# Patient Record
Sex: Female | Born: 1995
Health system: Southern US, Community
[De-identification: ages and names within clinical notes are randomized; demographics above are authoritative.]

## PROBLEM LIST (undated history)

## (undated) DIAGNOSIS — J45909 Unspecified asthma, uncomplicated: Secondary | ICD-10-CM

## (undated) DIAGNOSIS — J40 Bronchitis, not specified as acute or chronic: Secondary | ICD-10-CM

## (undated) HISTORY — PX: FINGER SURGERY: SHX640

---
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
## 2015-05-12 DIAGNOSIS — M79671 Pain in right foot: Secondary | ICD-10-CM | POA: Diagnosis present

## 2015-05-12 DIAGNOSIS — M79674 Pain in right toe(s): Secondary | ICD-10-CM | POA: Diagnosis not present

## 2015-05-13 ENCOUNTER — Emergency Department (HOSPITAL_BASED_OUTPATIENT_CLINIC_OR_DEPARTMENT_OTHER)
Admission: EM | Admit: 2015-05-13 | Discharge: 2015-05-13 | Disposition: A | Discharge status: Home / Self Care | Payer: Medicaid Other | Attending: Emergency Medicine | Admitting: Emergency Medicine

## 2015-05-13 ENCOUNTER — Emergency Department (HOSPITAL_BASED_OUTPATIENT_CLINIC_OR_DEPARTMENT_OTHER): Payer: Medicaid Other

## 2015-05-13 ENCOUNTER — Encounter (HOSPITAL_BASED_OUTPATIENT_CLINIC_OR_DEPARTMENT_OTHER): Payer: Self-pay | Admitting: Emergency Medicine

## 2015-05-13 DIAGNOSIS — M25571 Pain in right ankle and joints of right foot: Secondary | ICD-10-CM

## 2015-05-13 DIAGNOSIS — R52 Pain, unspecified: Secondary | ICD-10-CM

## 2015-05-13 MED ORDER — NAPROXEN 250 MG PO TABS
500.0000 mg | ORAL_TABLET | Freq: Once | ORAL | Status: AC
  Administered 2015-05-13: 500 mg via ORAL
  Filled 2015-05-13: qty 2

## 2015-05-13 MED ORDER — LORATADINE 10 MG PO TABS
10.0000 mg | ORAL_TABLET | Freq: Once | ORAL | Status: AC
  Administered 2015-05-13: 10 mg via ORAL
  Filled 2015-05-13: qty 1

## 2015-05-13 MED ORDER — NAPROXEN 375 MG PO TABS: 375.0000 mg | ORAL_TABLET | Freq: Two times a day (BID) | ORAL | Status: DC

## 2015-05-13 NOTE — ED Notes (Signed)
Patient reports that she has a possible FB in her right foot.

## 2015-05-13 NOTE — ED Notes (Signed)
MD at bedside. 

## 2015-05-13 NOTE — ED Notes (Signed)
No foreign body seen, no bleeding or discharge.

## 2015-05-13 NOTE — ED Notes (Signed)
Patient transported to X-ray 

## 2015-05-13 NOTE — ED Provider Notes (Signed)
CSN: [id number]     Arrival date & time 05/12/15  2359 History   First MD Initiated Contact with Patient 05/13/15 0102     Chief Complaint  Patient presents with  . Foot Pain     (Consider location/radiation/quality/duration/timing/severity/associated sxs/prior Treatment) Patient is a 19 y.o. female presenting with lower extremity pain. The history is provided by the patient.  Foot Pain This is a new problem. The current episode started 12 to 24 hours ago. The problem occurs constantly. The problem has not changed since onset.Pertinent negatives include no chest pain. Nothing aggravates the symptoms. Nothing relieves the symptoms. She has tried nothing for the symptoms. The treatment provided no relief.  thinks there was a bug under her foot while walking around barefoot now has toe pain  History reviewed. No pertinent past medical history. History reviewed. No pertinent past surgical history. History reviewed. No pertinent family history. History  Substance Use Topics  . Smoking status: Never Smoker   . Smokeless tobacco: Not on file  . Alcohol Use: No   OB History    No data available     Review of Systems  Cardiovascular: Negative for chest pain.  Skin: Negative for wound.  All other systems reviewed and are negative.     Allergies  Review of patient's allergies indicates no known allergies.  Home Medications   Prior to Admission medications   Not on File   BP 101/65 mmHg  Pulse 89  Temp(Src) 97.8 F (36.6 C) (Oral)  Resp 16  SpO2 100%  LMP 05/04/2015 Physical Exam  Constitutional: She is oriented to person, place, and time. She appears well-developed and well-nourished. No distress.  HENT:  Head: Normocephalic and atraumatic.  Mouth/Throat: Oropharynx is clear and moist.  Eyes: Conjunctivae are normal. Pupils are equal, round, and reactive to light.  Neck: Neck supple.  Cardiovascular: Normal rate, regular rhythm and intact distal pulses.    Pulmonary/Chest: Effort normal and breath sounds normal. No respiratory distress. She has no wheezes. She has no rales.  Abdominal: Soft. Bowel sounds are normal. There is no tenderness. There is no rebound and no guarding.  Musculoskeletal: Normal range of motion.       Right foot: There is normal range of motion, no bony tenderness, no swelling, normal capillary refill, no crepitus, no deformity and no laceration.  No lacerations nor punctures anywhere on the foot.  No envenomation sites.  No warmth or erythema. FROM   Neurological: She is alert and oriented to person, place, and time. She has normal reflexes.  Skin: Skin is warm and dry.  Psychiatric: She has a normal mood and affect.    ED Course  Procedures (including critical care time) Labs Review Labs Reviewed - No data to display  Imaging Review No results found.   EKG Interpretation None      MDM   Final diagnoses:  Pain   nSAIDS ICE AND ELEVATION NO SIGNS OF FOREIGN BODIES OR SKIN PUNCTURES OR LACERATIONS    Mathayus Stanbery, MD 05/13/15 0300

## 2015-11-05 ENCOUNTER — Encounter (HOSPITAL_BASED_OUTPATIENT_CLINIC_OR_DEPARTMENT_OTHER): Payer: Self-pay | Admitting: *Deleted

## 2015-11-05 ENCOUNTER — Emergency Department (HOSPITAL_BASED_OUTPATIENT_CLINIC_OR_DEPARTMENT_OTHER): Payer: Medicaid Other

## 2015-11-05 ENCOUNTER — Emergency Department (HOSPITAL_BASED_OUTPATIENT_CLINIC_OR_DEPARTMENT_OTHER)
Admission: EM | Admit: 2015-11-05 | Discharge: 2015-11-05 | Disposition: A | Discharge status: Home / Self Care | Payer: Medicaid Other | Attending: Emergency Medicine | Admitting: Emergency Medicine

## 2015-11-05 DIAGNOSIS — Y998 Other external cause status: Secondary | ICD-10-CM | POA: Diagnosis not present

## 2015-11-05 DIAGNOSIS — Z791 Long term (current) use of non-steroidal anti-inflammatories (NSAID): Secondary | ICD-10-CM | POA: Diagnosis not present

## 2015-11-05 DIAGNOSIS — S161XXA Strain of muscle, fascia and tendon at neck level, initial encounter: Secondary | ICD-10-CM | POA: Insufficient documentation

## 2015-11-05 DIAGNOSIS — S0990XA Unspecified injury of head, initial encounter: Secondary | ICD-10-CM | POA: Insufficient documentation

## 2015-11-05 DIAGNOSIS — Y9389 Activity, other specified: Secondary | ICD-10-CM | POA: Diagnosis not present

## 2015-11-05 DIAGNOSIS — S199XXA Unspecified injury of neck, initial encounter: Secondary | ICD-10-CM | POA: Diagnosis present

## 2015-11-05 DIAGNOSIS — W1839XA Other fall on same level, initial encounter: Secondary | ICD-10-CM | POA: Diagnosis not present

## 2015-11-05 DIAGNOSIS — Y9289 Other specified places as the place of occurrence of the external cause: Secondary | ICD-10-CM | POA: Diagnosis not present

## 2015-11-05 MED ORDER — IBUPROFEN 400 MG PO TABS
400.0000 mg | ORAL_TABLET | Freq: Once | ORAL | Status: DC
  Filled 2015-11-05: qty 1

## 2015-11-05 NOTE — ED Notes (Signed)
Pt ran and flipped on bed yesterday, c/o neck pain, states, "I can turn it from side to side, but hurts to moved up and down, can't hold my head up", pt is holding head up at this time. Also mentions HA, worse this morning upon waking. Alrt, NAD, calm, interactive, no dyspnea noted.

## 2015-11-05 NOTE — ED Notes (Signed)
Attempted rounding. Patient not in room.

## 2015-11-05 NOTE — Discharge Instructions (Signed)
Cervical Sprain  A cervical sprain is an injury in the neck in which the strong, fibrous tissues (ligaments) that connect your neck bones stretch or tear. Cervical sprains can range from mild to severe. Severe cervical sprains can cause the neck vertebrae to be unstable. This can lead to damage of the spinal cord and can result in serious nervous system problems. The amount of time it takes for a cervical sprain to get better depends on the cause and extent of the injury. Most cervical sprains heal in 1 to 3 weeks.  CAUSES   Severe cervical sprains may be caused by:    Contact sport injuries (such as from football, rugby, wrestling, hockey, auto racing, gymnastics, diving, martial arts, or boxing).    Motor vehicle collisions.    Whiplash injuries. This is an injury from a sudden forward and backward whipping movement of the head and neck.   Falls.   Mild cervical sprains may be caused by:    Being in an awkward position, such as while cradling a telephone between your ear and shoulder.    Sitting in a chair that does not offer proper support.    Working at a poorly designed computer station.    Looking up or down for long periods of time.   SYMPTOMS    Pain, soreness, stiffness, or a burning sensation in the front, back, or sides of the neck. This discomfort may develop immediately after the injury or slowly, 24 hours or more after the injury.    Pain or tenderness directly in the middle of the back of the neck.    Shoulder or upper back pain.    Limited ability to move the neck.    Headache.    Dizziness.    Weakness, numbness, or tingling in the hands or arms.    Muscle spasms.    Difficulty swallowing or chewing.    Tenderness and swelling of the neck.   DIAGNOSIS   Most of the time your health care provider can diagnose a cervical sprain by taking your history and doing a physical exam. Your health care provider will ask about previous neck injuries and any known neck  problems, such as arthritis in the neck. X-rays may be taken to find out if there are any other problems, such as with the bones of the neck. Other tests, such as a CT scan or MRI, may also be needed.   TREATMENT   Treatment depends on the severity of the cervical sprain. Mild sprains can be treated with rest, keeping the neck in place (immobilization), and pain medicines. Severe cervical sprains are immediately immobilized. Further treatment is done to help with pain, muscle spasms, and other symptoms and may include:   Medicines, such as pain relievers, numbing medicines, or muscle relaxants.    Physical therapy. This may involve stretching exercises, strengthening exercises, and posture training. Exercises and improved posture can help stabilize the neck, strengthen muscles, and help stop symptoms from returning.   HOME CARE INSTRUCTIONS    Put ice on the injured area.     Put ice in a plastic bag.     Place a towel between your skin and the bag.     Leave the ice on for 15-20 minutes, 3-4 times a day.    If your injury was severe, you may have been given a cervical collar to wear. A cervical collar is a two-piece collar designed to keep your neck from moving while it heals.      Do not remove the collar unless instructed by your health care provider.    If you have long hair, keep it outside of the collar.    Ask your health care provider before making any adjustments to your collar. Minor adjustments may be required over time to improve comfort and reduce pressure on your chin or on the back of your head.    Ifyou are allowed to remove the collar for cleaning or bathing, follow your health care provider's instructions on how to do so safely.    Keep your collar clean by wiping it with mild soap and water and drying it completely. If the collar you have been given includes removable pads, remove them every 1-2 days and hand wash them with soap and water. Allow them to air dry. They should be completely  dry before you wear them in the collar.    If you are allowed to remove the collar for cleaning and bathing, wash and dry the skin of your neck. Check your skin for irritation or sores. If you see any, tell your health care provider.    Do not drive while wearing the collar.    Only take over-the-counter or prescription medicines for pain, discomfort, or fever as directed by your health care provider.    Keep all follow-up appointments as directed by your health care provider.    Keep all physical therapy appointments as directed by your health care provider.    Make any needed adjustments to your workstation to promote good posture.    Avoid positions and activities that make your symptoms worse.    Warm up and stretch before being active to help prevent problems.   SEEK MEDICAL CARE IF:    Your pain is not controlled with medicine.    You are unable to decrease your pain medicine over time as planned.    Your activity level is not improving as expected.   SEEK IMMEDIATE MEDICAL CARE IF:    You develop any bleeding.   You develop stomach upset.   You have signs of an allergic reaction to your medicine.    Your symptoms get worse.    You develop new, unexplained symptoms.    You have numbness, tingling, weakness, or paralysis in any part of your body.   MAKE SURE YOU:    Understand these instructions.   Will watch your condition.   Will get help right away if you are not doing well or get worse.     This information is not intended to replace advice given to you by your health care provider. Make sure you discuss any questions you have with your health care provider.     Document Released: 09/22/2007 Document Revised: 11/30/2013 Document Reviewed: 06/02/2013  Elsevier Interactive Patient Education 2016 [employer]

## 2015-11-05 NOTE — ED Notes (Signed)
Patient transported to X-ray 

## 2015-11-05 NOTE — ED Provider Notes (Signed)
CSN: [id number]     Arrival date & time 11/05/15  2108 History  By signing my name below, I, Shelia Richards, attest that this documentation has been prepared under the direction and in the presence of Shelia BuccoMelanie Jameica Couts, MD. Electronically Signed: Jarvis Morganaylor Richards, ED Scribe. 11/05/2015. 11:03 PM.    Chief Complaint  Patient presents with  . Neck Injury   The history is provided by the patient. No language interpreter was used.    HPI Comments: Shelia Richards is a 19 y.o. female who presents to the Emergency Department complaining of constant, moderate, neck pain onset 2 days. She states she ran and flipped onto her bed and landed on her head. She denies any neck pain prior to the injury. Pt reports associated frontal HA that began today. She endorses the pain in her neck is exacerbated with turning her head. She denies any alleviating factors or medications taken prior to arrival. Pt denies any LOC during the injury. She denies any numbness or weakness in upper extremities, nausea, vomiting, fevers, or other associated symptoms.   History reviewed. No pertinent past medical history. Past Surgical History  Procedure Laterality Date  . Finger surgery Left     ring finger, couple years ago   History reviewed. No pertinent family history. Social History  Substance Use Topics  . Smoking status: Never Smoker   . Smokeless tobacco: None  . Alcohol Use: No   OB History    No data available     Review of Systems  Constitutional: Negative for fever, chills, diaphoresis and fatigue.  HENT: Negative for congestion, rhinorrhea and sneezing.   Eyes: Negative.   Respiratory: Negative for cough, chest tightness and shortness of breath.   Cardiovascular: Negative for chest pain and leg swelling.  Gastrointestinal: Negative for nausea, vomiting, abdominal pain, diarrhea and blood in stool.  Genitourinary: Negative for frequency, hematuria, flank pain and difficulty urinating.  Musculoskeletal:  Positive for neck pain. Negative for back pain and arthralgias.  Skin: Negative for rash.  Neurological: Positive for headaches. Negative for dizziness, speech difficulty, weakness and numbness.      Allergies  Review of patient's allergies indicates no known allergies.  Home Medications   Prior to Admission medications   Medication Sig Start Date End Date Taking? Authorizing Provider  naproxen (NAPROSYN) 375 MG tablet Take 1 tablet (375 mg total) by mouth 2 (two) times daily. 05/13/15   April Palumbo, MD   Triage Vitals: BP 106/66 mmHg  Pulse 86  Temp(Src) 99 F (37.2 C) (Oral)  Resp 18  Ht 5\' 5"  (1.651 m)  Wt 181 lb (82.101 kg)  BMI 30.12 kg/m2  SpO2 99%  LMP 10/23/2015  Physical Exam  Constitutional: She is oriented to person, place, and time. She appears well-developed and well-nourished.  HENT:  Head: Normocephalic and atraumatic.  No photophobia  Eyes: Pupils are equal, round, and reactive to light.  Neck: Normal range of motion. Neck supple.  Cardiovascular: Normal rate, regular rhythm and normal heart sounds.   Pulmonary/Chest: Effort normal and breath sounds normal. No respiratory distress. She has no wheezes. She has no rales. She exhibits no tenderness.  Abdominal: Soft. Bowel sounds are normal. There is no tenderness. There is no rebound and no guarding.  Musculoskeletal: Normal range of motion. She exhibits no edema.       Cervical back: She exhibits tenderness.  Tenderness over c6-c7. No other pain along the spine.  Lymphadenopathy:    She has no cervical adenopathy.  Neurological:  She is alert and oriented to person, place, and time. She has normal strength. No sensory deficit.  Skin: Skin is warm and dry. No rash noted.  Psychiatric: She has a normal mood and affect.    ED Course  Procedures (including critical care time)  DIAGNOSTIC STUDIES: Oxygen Saturation is 99% on RA, normal by my interpretation.    COORDINATION OF CARE:  10:24 PM- will order  imaging of c-spine.  Pt advised of plan for treatment and pt agrees.    Labs Review Labs Reviewed - No data to display  Imaging Review Dg Cervical Spine Complete  11/05/2015  CLINICAL DATA:  Neck pain of 2 days duration. EXAM: CERVICAL SPINE - COMPLETE 4+ VIEW COMPARISON:  None. FINDINGS: There is mild reversal of cervical lordosis. This could represent muscle spasm. No fracture is evident. No bone lesion or bony destruction. No arthritic changes. IMPRESSION: Negative for acute fracture. Electronically Signed   By: Ellery Plunk M.D.   On: 11/05/2015 22:56   I have personally reviewed and evaluated these images and lab results as part of my medical decision-making.   EKG Interpretation None      MDM   Final diagnoses:  Neck strain, initial encounter    Patient presents with pain to her lower neck after flipping over onto her head. Evidence of fracture. No neurologic deficits. No signs of systemic illness. She was discharged home in good condition. She was advised to use ibuprofen for symptomatic relief and follow-up with her pediatrician if her symptoms are not improving.  I personally performed the services described in this documentation, which was scribed in my presence.  The recorded information has been reviewed and considered.     Shelia Bucco, MD 11/05/15 [phone]

## 2015-11-15 ENCOUNTER — Emergency Department (HOSPITAL_BASED_OUTPATIENT_CLINIC_OR_DEPARTMENT_OTHER)
Admission: EM | Admit: 2015-11-15 | Discharge: 2015-11-15 | Disposition: A | Discharge status: Home / Self Care | Payer: Medicaid Other | Attending: Emergency Medicine | Admitting: Emergency Medicine

## 2015-11-15 ENCOUNTER — Encounter (HOSPITAL_BASED_OUTPATIENT_CLINIC_OR_DEPARTMENT_OTHER): Payer: Self-pay

## 2015-11-15 DIAGNOSIS — Z3202 Encounter for pregnancy test, result negative: Secondary | ICD-10-CM | POA: Insufficient documentation

## 2015-11-15 DIAGNOSIS — R1013 Epigastric pain: Secondary | ICD-10-CM | POA: Insufficient documentation

## 2015-11-15 DIAGNOSIS — R51 Headache: Secondary | ICD-10-CM | POA: Insufficient documentation

## 2015-11-15 DIAGNOSIS — R1011 Right upper quadrant pain: Secondary | ICD-10-CM | POA: Insufficient documentation

## 2015-11-15 DIAGNOSIS — R197 Diarrhea, unspecified: Secondary | ICD-10-CM | POA: Insufficient documentation

## 2015-11-15 DIAGNOSIS — M545 Low back pain: Secondary | ICD-10-CM | POA: Insufficient documentation

## 2015-11-15 DIAGNOSIS — R1110 Vomiting, unspecified: Secondary | ICD-10-CM | POA: Insufficient documentation

## 2015-11-15 DIAGNOSIS — R1012 Left upper quadrant pain: Secondary | ICD-10-CM | POA: Insufficient documentation

## 2015-11-15 DIAGNOSIS — Z79899 Other long term (current) drug therapy: Secondary | ICD-10-CM | POA: Insufficient documentation

## 2015-11-15 LAB — COMPREHENSIVE METABOLIC PANEL
ALT: 15 U/L (ref 14–54)
ANION GAP: 6 (ref 5–15)
AST: 22 U/L (ref 15–41)
Albumin: 4.1 g/dL (ref 3.5–5.0)
Alkaline Phosphatase: 63 U/L (ref 38–126)
BUN: 10 mg/dL (ref 6–20)
CHLORIDE: 105 mmol/L (ref 101–111)
CO2: 28 mmol/L (ref 22–32)
CREATININE: 0.85 mg/dL (ref 0.44–1.00)
Calcium: 9.7 mg/dL (ref 8.9–10.3)
GFR calc non Af Amer: >60 mL/min (ref >60)
Glucose, Bld: 78 mg/dL (ref 65–99)
POTASSIUM: 4.3 mmol/L (ref 3.5–5.1)
SODIUM: 139 mmol/L (ref 135–145)
Total Bilirubin: 0.4 mg/dL (ref 0.3–1.2)
Total Protein: 7.9 g/dL (ref 6.5–8.1)

## 2015-11-15 LAB — CBC WITH DIFFERENTIAL/PLATELET
BASOS ABS: 0 10*3/uL (ref 0.0–0.1)
BASOS PCT: 0 %
EOS ABS: 0.1 10*3/uL (ref 0.0–0.7)
EOS PCT: 1 %
HCT: 40.3 % (ref 36.0–46.0)
Hemoglobin: 13.4 g/dL (ref 12.0–15.0)
Lymphocytes Relative: 43 %
Lymphs Abs: 3.3 10*3/uL (ref 0.7–4.0)
MCH: 27.8 pg (ref 26.0–34.0)
MCHC: 33.3 g/dL (ref 30.0–36.0)
MCV: 83.6 fL (ref 78.0–100.0)
MONO ABS: 0.7 10*3/uL (ref 0.1–1.0)
MONOS PCT: 9 %
Neutro Abs: 3.6 10*3/uL (ref 1.7–7.7)
Neutrophils Relative %: 47 %
PLATELETS: 368 10*3/uL (ref 150–400)
RBC: 4.82 MIL/uL (ref 3.87–5.11)
RDW: 13.3 % (ref 11.5–15.5)
WBC: 7.6 10*3/uL (ref 4.0–10.5)

## 2015-11-15 LAB — URINALYSIS, ROUTINE W REFLEX MICROSCOPIC
BILIRUBIN URINE: NEGATIVE
Glucose, UA: NEGATIVE mg/dL
Ketones, ur: NEGATIVE mg/dL
Leukocytes, UA: NEGATIVE
NITRITE: NEGATIVE
PROTEIN: NEGATIVE mg/dL
SPECIFIC GRAVITY, URINE: 1.028 (ref 1.005–1.030)
pH: 6 (ref 5.0–8.0)

## 2015-11-15 LAB — URINE MICROSCOPIC-ADD ON

## 2015-11-15 LAB — LIPASE, BLOOD: LIPASE: 24 U/L (ref 11–51)

## 2015-11-15 LAB — PREGNANCY, URINE: PREG TEST UR: NEGATIVE

## 2015-11-15 MED ORDER — ACETAMINOPHEN 325 MG PO TABS
650.0000 mg | ORAL_TABLET | Freq: Once | ORAL | Status: AC
  Administered 2015-11-15: 650 mg via ORAL
  Filled 2015-11-15: qty 2

## 2015-11-15 MED ORDER — RANITIDINE HCL 150 MG PO TABS: 150.0000 mg | ORAL_TABLET | Freq: Two times a day (BID) | ORAL | Status: DC

## 2015-11-15 MED ORDER — ACETAMINOPHEN 325 MG PO TABS
ORAL_TABLET | ORAL | Status: AC
  Filled 2015-11-15: qty 1

## 2015-11-15 MED ORDER — SODIUM CHLORIDE 0.9 % IV BOLUS (SEPSIS)
1000.0000 mL | Freq: Once | INTRAVENOUS | Status: AC
  Administered 2015-11-15: 1000 mL via INTRAVENOUS

## 2015-11-15 MED ORDER — GI COCKTAIL ~~LOC~~
30.0000 mL | Freq: Once | ORAL | Status: AC
  Administered 2015-11-15: 30 mL via ORAL
  Filled 2015-11-15: qty 30

## 2015-11-15 MED ORDER — PANTOPRAZOLE SODIUM 40 MG PO TBEC: 40.0000 mg | DELAYED_RELEASE_TABLET | Freq: Every day | ORAL | Status: DC

## 2015-11-15 NOTE — ED Provider Notes (Signed)
CSN: [id number]     Arrival date & time 11/15/15  1135 History   First MD Initiated Contact with Patient 11/15/15 1158     Chief Complaint  Patient presents with  . Abdominal Pain     (Consider location/radiation/quality/duration/timing/severity/associated sxs/prior Treatment) HPI  Shelia Richards is a 19 y.o. female with no significant PMH who presents for intermittent, gradually worsening, mild epigastric abdominal pain for the past month and a half. She describes it as sharp and it occasionally radiates across the upper abdomen. She states she sometimes feel like her "stomach is rumbling".  Patient has not tried anything for her symptoms. She states it could be related to food, however, it sometimes hurts in the absence of food. Associated symptoms include headache, vomiting, occasional bloody diarrhea, and low back pain. Denies nausea, constipation, sore throat, urinary symptoms, vaginal discharge or vaginal bleeding, or bowel/ bladder incontinence. LMP yesterday. Denies any history of abdominal surgeries or recent diet changes.  She states her diet consists of one small meal in the morning and then a large meal at night.  She reports she has not seen her pediatrician for this problem.  No recent trauma or injury.    History reviewed. No pertinent past medical history. Past Surgical History  Procedure Laterality Date  . Finger surgery Left     ring finger, couple years ago   No family history on file. Social History  Substance Use Topics  . Smoking status: Never Smoker   . Smokeless tobacco: None  . Alcohol Use: No   OB History    No data available     Review of Systems All other systems negative unless otherwise stated in HPI    Allergies  Review of patient's allergies indicates no known allergies.  Home Medications   Prior to Admission medications   Medication Sig Start Date End Date Taking? Authorizing Provider  pantoprazole (PROTONIX) 40 MG tablet Take 1 tablet (40 mg  total) by mouth daily. 11/15/15   Cheri FowlerKayla Mikeyla Music, PA-C  ranitidine (ZANTAC) 150 MG tablet Take 1 tablet (150 mg total) by mouth 2 (two) times daily. 11/15/15   Cheri FowlerKayla Makiya Jeune, PA-C   LMP 11/14/2015 Physical Exam  Constitutional: She is oriented to person, place, and time. She appears well-developed and well-nourished.  HENT:  Head: Normocephalic and atraumatic.  Mouth/Throat: Oropharynx is clear and moist.  Eyes: Conjunctivae are normal. Pupils are equal, round, and reactive to light.  Neck: Normal range of motion. Neck supple.  Cardiovascular: Normal rate, regular rhythm and normal heart sounds.   No murmur heard. Pulmonary/Chest: Effort normal and breath sounds normal. No accessory muscle usage or stridor. No respiratory distress. She has no wheezes. She has no rhonchi. She has no rales.  Abdominal: Soft. Bowel sounds are normal. She exhibits no distension. There is tenderness (mild) in the right upper quadrant, epigastric area and left upper quadrant. There is no rigidity, no rebound, no guarding and no CVA tenderness.  Musculoskeletal: Normal range of motion.  Lymphadenopathy:    She has no cervical adenopathy.  Neurological: She is alert and oriented to person, place, and time.  Speech clear without dysarthria.  Cranial nerves grossly intact. Strength and sensation intact bilaterally throughout upper and lower extremities.  No pronator drift.    Skin: Skin is warm and dry.  Psychiatric: She has a normal mood and affect. Her behavior is normal.    ED Course  Procedures (including critical care time) Labs Review Labs Reviewed  URINALYSIS, ROUTINE W REFLEX  MICROSCOPIC (NOT AT [facility]) - Abnormal; Notable for the following:    Hgb urine dipstick MODERATE (*)    All other components within normal limits  URINE MICROSCOPIC-ADD ON - Abnormal; Notable for the following:    Squamous Epithelial / LPF 0-5 (*)    Bacteria, UA RARE (*)    All other components within normal limits  PREGNANCY, URINE   COMPREHENSIVE METABOLIC PANEL  LIPASE, BLOOD  CBC WITH DIFFERENTIAL/PLATELET    Imaging Review No results found. I have personally reviewed and evaluated these images and lab results as part of my medical decision-making.   EKG Interpretation None      MDM   Final diagnoses:  Epigastric pain    Patient presents with 1.5 month hx of epigastric abdominal pain.  VSS, NAD.  On exam, heart RRR, lungs CTAB, abdomen soft with mild tenderness in the RUQ/epigastric/LUQ no rebound or guarding.  Will obtain labs.  Will give fluids, gi cocktail, and tylenol.  Suspect GERD vs dietary intolerance vs IBS vs flatulence vs biliary colic.  Low suspicion for appendicitis, SBO, pyelonephritis, ovarian torsion, or TOA.  Labs unremarkable.  Moderate hematuria--patient is on her period.  Symptoms improved with gi cocktail and tylenol.  Suspect GERD, will d/c home with protonix and zantac. Follow up PCP.  Discussed return precautions.  Patient agrees and acknowledges the above plan for discharge.  Case has been discussed with Dr. Anitra Lauth who agrees with the above plan for discharge.     Cheri Fowler, PA-C 11/15/15 1336  Gwyneth Sprout, MD 11/18/15 1540

## 2015-11-15 NOTE — ED Notes (Signed)
C/o abd pain x 1 month, lower back pain x today

## 2015-11-15 NOTE — ED Notes (Signed)
Pt assisted to restroom by Jonny RuizJohn, EMT and pt has a tylenol tablet in her hand. When Jonny RuizJohn asks pt why she has a pill, pt states 'I only wanted to take one of them." second tylenol tablet discarded, another tablet pulled from pyxis under "override" and given to pt. Pt is hesitant, states 'I think two are too many." explained to pt that two tablets are a normal dose, and that it won't hurt her. Pt takes second tablet at this time.

## 2015-11-15 NOTE — ED Notes (Signed)
Family now at bedside. Pt is alert and cooperative.

## 2015-11-15 NOTE — Discharge Instructions (Signed)
Abdominal Pain, Adult °Many things can cause abdominal pain. Usually, abdominal pain is not caused by a disease and will improve without treatment. It can often be observed and treated at home. Your health care provider will do a physical exam and possibly order blood tests and X-rays to help determine the seriousness of your pain. However, in many cases, more time must pass before a clear cause of the pain can be found. Before that point, your health care provider may not know if you need more testing or further treatment. °HOME CARE INSTRUCTIONS °Monitor your abdominal pain for any changes. The following actions may help to alleviate any discomfort you are experiencing: °· Only take over-the-counter or prescription medicines as directed by your health care provider. °· Do not take laxatives unless directed to do so by your health care provider. °· Try a clear liquid diet (broth, tea, or water) as directed by your health care provider. Slowly move to a bland diet as tolerated. °SEEK MEDICAL CARE IF: °· You have unexplained abdominal pain. °· You have abdominal pain associated with nausea or diarrhea. °· You have pain when you urinate or have a bowel movement. °· You experience abdominal pain that wakes you in the night. °· You have abdominal pain that is worsened or improved by eating food. °· You have abdominal pain that is worsened with eating fatty foods. °· You have a fever. °SEEK IMMEDIATE MEDICAL CARE IF: °· Your pain does not go away within 2 hours. °· You keep throwing up (vomiting). °· Your pain is felt only in portions of the abdomen, such as the right side or the left lower portion of the abdomen. °· You pass bloody or black tarry stools. °MAKE SURE YOU: °· Understand these instructions. °· Will watch your condition. °· Will get help right away if you are not doing well or get worse. °  °This information is not intended to replace advice given to you by your health care provider. Make sure you discuss  any questions you have with your health care provider. °  °Document Released: 09/04/2005 Document Revised: 08/16/2015 Document Reviewed: 08/04/2013 °Elsevier Interactive Patient Education ©2016 Elsevier Inc. ° °Gastroesophageal Reflux Disease, Adult °Normally, food travels down the esophagus and stays in the stomach to be digested. If a person has gastroesophageal reflux disease (GERD), food and stomach acid move back up into the esophagus. When this happens, the esophagus becomes sore and swollen (inflamed). Over time, GERD can make small holes (ulcers) in the lining of the esophagus. °HOME CARE °Diet  °· Follow a diet as told by your doctor. You may need to avoid foods and drinks such as: °¨ Coffee and tea (with or without caffeine). °¨ Drinks that contain alcohol. °¨ Energy drinks and sports drinks. °¨ Carbonated drinks or sodas. °¨ Chocolate and cocoa. °¨ Peppermint and mint flavorings. °¨ Garlic and onions. °¨ Horseradish. °¨ Spicy and acidic foods, such as peppers, chili powder, curry powder, vinegar, hot sauces, and BBQ sauce. °¨ Citrus fruit juices and citrus fruits, such as oranges, lemons, and limes. °¨ Tomato-based foods, such as red sauce, chili, salsa, and pizza with red sauce. °¨ Fried and fatty foods, such as donuts, french fries, potato chips, and high-fat dressings. °¨ High-fat meats, such as hot dogs, rib eye steak, sausage, ham, and bacon. °¨ High-fat dairy items, such as whole milk, butter, and cream cheese. °· Eat small meals often. Avoid eating large meals. °· Avoid drinking large amounts of liquid with your meals. °· Avoid   meals.  Avoid eating meals during the 2-3 hours before bedtime.  Avoid lying down right after you eat.  Do not exercise right after you eat. General Instructions  Pay attention to any changes in your symptoms.  Take over-the-counter and prescription medicines only as told by your doctor. Do not take aspirin, ibuprofen, or other NSAIDs unless your doctor says it is okay.  Do not use any tobacco  products, including cigarettes, chewing tobacco, and e-cigarettes. If you need help quitting, ask your doctor.  Wear loose clothes. Do not wear anything tight around your waist.  Raise (elevate) the head of your bed about 6 inches (15 cm).  Try to lower your stress. If you need help doing this, ask your doctor.  If you are overweight, lose an amount of weight that is healthy for you. Ask your doctor about a safe weight loss goal.  Keep all follow-up visits as told by your doctor. This is important. GET HELP IF:  You have new symptoms.  You lose weight and you do not know why it is happening.  You have trouble swallowing, or it hurts to swallow.  You have wheezing or a cough that keeps happening.  Your symptoms do not get better with treatment.  You have a hoarse voice. GET HELP RIGHT AWAY IF:  You have pain in your arms, neck, jaw, teeth, or back.  You feel sweaty, dizzy, or light-headed.  You have chest pain or shortness of breath.  You throw up (vomit) and your throw up looks like blood or coffee grounds.  You pass out (faint).  Your poop (stool) is bloody or black.  You cannot swallow, drink, or eat. This information is not intended to replace advice given to you by your health care provider. Make sure you discuss any questions you have with your health care provider.  Document Released: 05/13/2008 Document Revised: 08/16/2015 Document Reviewed: 03/22/2015  Elsevier Interactive Patient Education [employer]2016 Elsevier Inc.

## 2015-11-15 NOTE — ED Notes (Signed)
Pt crying, states that she is scared. This RN stays with pt and assists her to attempt to call her mother and her father, with no answer for either. Pt comforted by this rn, given call button to call me if she needs me. Warm blankets given, lights dimmed.

## 2015-11-27 ENCOUNTER — Encounter (HOSPITAL_BASED_OUTPATIENT_CLINIC_OR_DEPARTMENT_OTHER): Payer: Self-pay | Admitting: *Deleted

## 2015-11-27 ENCOUNTER — Emergency Department (HOSPITAL_BASED_OUTPATIENT_CLINIC_OR_DEPARTMENT_OTHER)
Admission: EM | Admit: 2015-11-27 | Discharge: 2015-11-27 | Discharge status: Against Medical Advice | Payer: Medicaid Other | Attending: Emergency Medicine | Admitting: Emergency Medicine

## 2015-11-27 DIAGNOSIS — J029 Acute pharyngitis, unspecified: Secondary | ICD-10-CM | POA: Insufficient documentation

## 2015-11-27 LAB — URINALYSIS, ROUTINE W REFLEX MICROSCOPIC
BILIRUBIN URINE: NEGATIVE
Glucose, UA: NEGATIVE mg/dL
HGB URINE DIPSTICK: NEGATIVE
KETONES UR: NEGATIVE mg/dL
Leukocytes, UA: NEGATIVE
NITRITE: NEGATIVE
PROTEIN: NEGATIVE mg/dL
SPECIFIC GRAVITY, URINE: 1.021 (ref 1.005–1.030)
pH: 8 (ref 5.0–8.0)

## 2015-11-27 LAB — RAPID STREP SCREEN (MED CTR MEBANE ONLY): STREPTOCOCCUS, GROUP A SCREEN (DIRECT): NEGATIVE

## 2015-11-27 LAB — PREGNANCY, URINE: PREG TEST UR: NEGATIVE

## 2015-11-27 NOTE — ED Notes (Signed)
Pt c/o generalized cold symptoms, has not tried anything over the counter to help with symptoms.  Pt is on Facebook during exam, no acute distress noted.  Pt has not had any emesis episodes today and has been able to hold down fluids without issue.

## 2015-11-27 NOTE — ED Notes (Signed)
Sore throat, vomiting and headache. Symptoms x 4 days.

## 2015-11-27 NOTE — ED Notes (Signed)
Pt seen leaving out of ED waiting room. Patient would not answer nurse when called.

## 2015-11-30 LAB — CULTURE, GROUP A STREP

## 2015-12-01 ENCOUNTER — Emergency Department (HOSPITAL_BASED_OUTPATIENT_CLINIC_OR_DEPARTMENT_OTHER)
Admission: EM | Admit: 2015-12-01 | Discharge: 2015-12-01 | Disposition: A | Discharge status: Home / Self Care | Payer: Medicaid Other | Attending: Emergency Medicine | Admitting: Emergency Medicine

## 2015-12-01 ENCOUNTER — Emergency Department (HOSPITAL_BASED_OUTPATIENT_CLINIC_OR_DEPARTMENT_OTHER): Payer: Medicaid Other

## 2015-12-01 ENCOUNTER — Encounter (HOSPITAL_BASED_OUTPATIENT_CLINIC_OR_DEPARTMENT_OTHER): Payer: Self-pay

## 2015-12-01 DIAGNOSIS — J029 Acute pharyngitis, unspecified: Secondary | ICD-10-CM

## 2015-12-01 DIAGNOSIS — J209 Acute bronchitis, unspecified: Secondary | ICD-10-CM | POA: Insufficient documentation

## 2015-12-01 DIAGNOSIS — Z79899 Other long term (current) drug therapy: Secondary | ICD-10-CM | POA: Insufficient documentation

## 2015-12-01 MED ORDER — AEROCHAMBER PLUS W/MASK MISC
1.0000 | Freq: Once | Status: AC
  Administered 2015-12-01: 1
  Filled 2015-12-01: qty 1

## 2015-12-01 MED ORDER — ALBUTEROL SULFATE HFA 108 (90 BASE) MCG/ACT IN AERS
2.0000 | INHALATION_SPRAY | RESPIRATORY_TRACT | Status: DC | PRN
  Administered 2015-12-01: 2 via RESPIRATORY_TRACT
  Filled 2015-12-01: qty 6.7

## 2015-12-01 MED ORDER — ONDANSETRON 8 MG PO TBDP: 8.0000 mg | ORAL_TABLET | Freq: Three times a day (TID) | ORAL | Status: DC | PRN

## 2015-12-01 MED ORDER — IPRATROPIUM-ALBUTEROL 0.5-2.5 (3) MG/3ML IN SOLN
3.0000 mL | RESPIRATORY_TRACT | Status: DC
  Administered 2015-12-01: 3 mL via RESPIRATORY_TRACT
  Filled 2015-12-01: qty 3

## 2015-12-01 NOTE — ED Notes (Signed)
Pt verbalizes understanding of d/c instructions and denies any further needs at this time. 

## 2015-12-01 NOTE — ED Notes (Signed)
Pt c/o sore throat and a cough x2wks, no distress

## 2015-12-01 NOTE — ED Notes (Signed)
MD at bedside. 

## 2015-12-01 NOTE — ED Provider Notes (Signed)
CSN: [id number]     Arrival date & time 12/01/15  0055 History   First MD Initiated Contact with Patient 12/01/15 0252     Chief Complaint  Patient presents with  . Sore Throat     (Consider location/radiation/quality/duration/timing/severity/associated sxs/prior Treatment) HPI  This is a 19 year old female complains of a two-week history of cough. It is productive of sputum but she does not know what color because she swallows it. She has also had some wheezing, notably in her sleep according to her roommate. She has had a fever as high as 102.3. She has a sore throat, worse with swallowing. She states she has been vomiting "every other day". She has not been taking anything for her symptoms except cough drops. She checked into the ED 4 days ago and had an unremarkable urinalysis, negative strep screen and negative strep culture; she left prior to physician evaluation.  History reviewed. No pertinent past medical history. Past Surgical History  Procedure Laterality Date  . Finger surgery Left     ring finger, couple years ago   No family history on file. Social History  Substance Use Topics  . Smoking status: Never Smoker   . Smokeless tobacco: None  . Alcohol Use: No   OB History    No data available     Review of Systems  All other systems reviewed and are negative.   Allergies  Review of patient's allergies indicates no known allergies.  Home Medications   Prior to Admission medications   Medication Sig Start Date End Date Taking? Authorizing Provider  pantoprazole (PROTONIX) 40 MG tablet Take 1 tablet (40 mg total) by mouth daily. 11/15/15   Cheri FowlerKayla Rose, PA-C  ranitidine (ZANTAC) 150 MG tablet Take 1 tablet (150 mg total) by mouth 2 (two) times daily. 11/15/15   Kayla Rose, PA-C   BP 119/57 mmHg  Pulse 81  Temp(Src) 98.6 F (37 C) (Oral)  Resp 16  SpO2 97%  LMP 11/14/2015   Physical Exam General: Well-developed, well-nourished female in no acute distress;  appearance consistent with age of record HENT: normocephalic; atraumatic; pharyngeal erythema Eyes: pupils equal, round and reactive to light; extraocular muscles intact Neck: supple; anterior cervical lymphadenopathy Heart: regular rate and rhythm Lungs: Coarse expiratory wheezes Abdomen: soft; nondistended; nontender; bowel sounds present Extremities: No deformity; full range of motion Neurologic: Awake, alert and oriented; motor function intact in all extremities and symmetric; no facial droop Skin: Warm and dry Psychiatric: Normal mood and affect    ED Course  Procedures (including critical care time)   MDM  Nursing notes and vitals signs, including pulse oximetry, reviewed.  Summary of this visit's results, reviewed by myself:  Imaging Studies: Dg Chest 2 View  12/01/2015  CLINICAL DATA:  Cough, congestion, and fever EXAM: CHEST  2 VIEW COMPARISON:  None. FINDINGS: When allowing for interstitial crowding at the bases there is no edema, consolidation, effusion, or pneumothorax. Normal heart size and mediastinal contours. IMPRESSION: No active cardiopulmonary disease. Electronically Signed   By: Marnee SpringJonathon  Watts M.D.   On: 12/01/2015 01:58   3:37 AM Lungs clear after DuoNeb treatment.    Paula LibraJohn Elida Harbin, MD 12/01/15 [phone]
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
## 2016-02-11 ENCOUNTER — Emergency Department (HOSPITAL_BASED_OUTPATIENT_CLINIC_OR_DEPARTMENT_OTHER)
Admission: EM | Admit: 2016-02-11 | Discharge: 2016-02-11 | Disposition: A | Discharge status: Home / Self Care | Payer: Self-pay | Attending: Emergency Medicine | Admitting: Emergency Medicine

## 2016-02-11 ENCOUNTER — Emergency Department (HOSPITAL_BASED_OUTPATIENT_CLINIC_OR_DEPARTMENT_OTHER): Payer: Self-pay

## 2016-02-11 ENCOUNTER — Encounter (HOSPITAL_BASED_OUTPATIENT_CLINIC_OR_DEPARTMENT_OTHER): Payer: Self-pay | Admitting: Emergency Medicine

## 2016-02-11 DIAGNOSIS — R112 Nausea with vomiting, unspecified: Secondary | ICD-10-CM | POA: Insufficient documentation

## 2016-02-11 DIAGNOSIS — J40 Bronchitis, not specified as acute or chronic: Secondary | ICD-10-CM

## 2016-02-11 DIAGNOSIS — R197 Diarrhea, unspecified: Secondary | ICD-10-CM | POA: Insufficient documentation

## 2016-02-11 DIAGNOSIS — Z79899 Other long term (current) drug therapy: Secondary | ICD-10-CM | POA: Insufficient documentation

## 2016-02-11 MED ORDER — BENZONATATE 100 MG PO CAPS: 100.0000 mg | ORAL_CAPSULE | Freq: Three times a day (TID) | ORAL | Status: DC

## 2016-02-11 MED ORDER — ALBUTEROL SULFATE HFA 108 (90 BASE) MCG/ACT IN AERS
2.0000 | INHALATION_SPRAY | RESPIRATORY_TRACT | Status: DC | PRN
  Administered 2016-02-11: 2 via RESPIRATORY_TRACT
  Filled 2016-02-11: qty 6.7

## 2016-02-11 NOTE — ED Provider Notes (Signed)
CSN: [id number]     Arrival date & time 02/11/16  1900 History  By signing my name below, I, Tanda Rockers, attest that this documentation has been prepared under the direction and in the presence of Rolan Bucco, MD. Electronically Signed: Tanda Rockers, ED Scribe. 02/11/2016. 7:51 PM.   Chief Complaint  Patient presents with  . Cough   The history is provided by the patient. No language interpreter was used.     HPI Comments: Laree Garron is a 20 y.o. female who presents to the Emergency Department complaining of gradual onset, constant, cough x 2 weeks. Pt states that the cough varies from dry to productive in nature.  When pt has a bad coughing spell, she feels short of breath. She also complains of chest pain with coughing only, vomiting, and diarrhea. Pt mentions having rhinorrhea and nasal congestion last week that has since resolved on its own. She reports a fever with Tmax 102 today but did not take any medications for it. Her temperature in the ED is 99.4. Denies abdominal pain, leg pain, leg swelling, or any other associated symptoms. No hx asthma.    History reviewed. No pertinent past medical history. Past Surgical History  Procedure Laterality Date  . Finger surgery Left     ring finger, couple years ago   History reviewed. No pertinent family history. Social History  Substance Use Topics  . Smoking status: Never Smoker   . Smokeless tobacco: None  . Alcohol Use: No   OB History    No data available     Review of Systems  Constitutional: Positive for fever. Negative for chills, diaphoresis and fatigue.  HENT: Positive for congestion (resolved) and rhinorrhea (resolved). Negative for sneezing.   Eyes: Negative.   Respiratory: Positive for cough and shortness of breath. Negative for chest tightness.   Cardiovascular: Negative for chest pain and leg swelling.  Gastrointestinal: Positive for nausea, vomiting and diarrhea. Negative for abdominal pain and blood in stool.   Genitourinary: Negative for frequency, hematuria, flank pain and difficulty urinating.  Musculoskeletal: Negative for back pain and arthralgias.  Skin: Negative for rash.  Neurological: Negative for dizziness, speech difficulty, weakness, numbness and headaches.   Allergies  Review of patient's allergies indicates no known allergies.  Home Medications   Prior to Admission medications   Medication Sig Start Date End Date Taking? Authorizing Provider  benzonatate (TESSALON) 100 MG capsule Take 1 capsule (100 mg total) by mouth every 8 (eight) hours. 02/11/16   Rolan Bucco, MD  ondansetron (ZOFRAN ODT) 8 MG disintegrating tablet Take 1 tablet (8 mg total) by mouth every 8 (eight) hours as needed for nausea or vomiting. 12/01/15   John Molpus, MD  pantoprazole (PROTONIX) 40 MG tablet Take 1 tablet (40 mg total) by mouth daily. 11/15/15   Cheri Fowler, PA-C  ranitidine (ZANTAC) 150 MG tablet Take 1 tablet (150 mg total) by mouth 2 (two) times daily. 11/15/15   Kayla Rose, PA-C   BP 104/68 mmHg  Pulse 94  Temp(Src) 99.4 F (37.4 C) (Oral)  Resp 18  Wt 182 lb 8 oz (82.781 kg)  SpO2 100%   Physical Exam  Constitutional: She is oriented to person, place, and time. She appears well-developed and well-nourished.  HENT:  Head: Normocephalic and atraumatic.  Right Ear: External ear normal.  Left Ear: External ear normal.  Mouth/Throat: Oropharynx is clear and moist.  Eyes: Pupils are equal, round, and reactive to light.  Neck: Normal range of motion. Neck  supple.  Cardiovascular: Normal rate, regular rhythm and normal heart sounds.   Pulmonary/Chest: Effort normal. No respiratory distress. She has wheezes. She has no rales. She exhibits no tenderness.  Trace expiratory wheezing in the bases bilaterally  Abdominal: Soft. Bowel sounds are normal. There is no tenderness. There is no rebound and no guarding.  Musculoskeletal: Normal range of motion. She exhibits no edema.  Lymphadenopathy:     She has no cervical adenopathy.  Neurological: She is alert and oriented to person, place, and time.  Skin: Skin is warm and dry. No rash noted.  Psychiatric: She has a normal mood and affect.    ED Course  Procedures (including critical care time)  DIAGNOSTIC STUDIES: Oxygen Saturation is 100% on RA, normal by my interpretation.    COORDINATION OF CARE: 7:49 PM-Discussed treatment plan which includes Rx cough medicine and OTC Mucinex with pt at bedside and pt agreed to plan.   Labs Review Labs Reviewed - No data to display  Imaging Review Dg Chest 2 View  02/11/2016  CLINICAL DATA:  Cough, body aches, fever and sore throat. EXAM: CHEST  2 VIEW COMPARISON:  12/01/2015. FINDINGS: The cardiac silhouette, mediastinal and hilar contours are within normal limits and stable. The lungs demonstrate mild peribronchial thickening and slight increased interstitial markings which could suggest bronchitis. No infiltrates or effusions. The bony thorax is intact. IMPRESSION: Findings suggest bronchitis.  No focal infiltrates. Electronically Signed   By: Rudie MeyerP.  Gallerani M.D.   On: 02/11/2016 19:41   I have personally reviewed and evaluated these images as part of my medical decision-making.   EKG Interpretation None      MDM   Final diagnoses:  Bronchitis   Patient presents with cough and upper respiratory symptoms. Her chest x-ray shows no evidence of pneumonia. There is evidence of bronchitis which is likely viral. She has intermittent trace wheezing but no increased work of breathing or ongoing wheezing. I did dispense her albuterol inhaler to see if this improves her symptoms. I advised her to stop using it after a couple days it doesn't seem to be in helping her symptoms. She was also given a prescription for [employer]. She states she's had some intermittent vomiting but states she appears a pizza earlier this afternoon prior to coming in and had no vomiting with that. She has no increased  work of breathing or hypoxia. She's otherwise well-appearing. She was discharged home in good condition. Return precautions were given.  I personally performed the services described in this documentation, which was scribed in my presence.  The recorded information has been reviewed and considered.      Rolan BuccoMelanie Absalom Aro, MD 02/11/16 2007

## 2016-02-11 NOTE — ED Notes (Signed)
MD at bedside. 

## 2016-02-11 NOTE — ED Notes (Signed)
Pt in c/o cough and generally not feeling well onset last week but worse onset 2 days ago.

## 2016-02-11 NOTE — Discharge Instructions (Signed)
Upper Respiratory Infection, Adult Most upper respiratory infections (URIs) are a viral infection of the air passages leading to the lungs. A URI affects the nose, throat, and upper air passages. The most common type of URI is nasopharyngitis and is typically referred to as "the common cold." URIs run their course and usually go away on their own. Most of the time, a URI does not require medical attention, but sometimes a bacterial infection in the upper airways can follow a viral infection. This is called a secondary infection. Sinus and middle ear infections are common types of secondary upper respiratory infections. Bacterial pneumonia can also complicate a URI. A URI can worsen asthma and chronic obstructive pulmonary disease (COPD). Sometimes, these complications can require emergency medical care and may be life threatening.  CAUSES Almost all URIs are caused by viruses. A virus is a type of germ and can spread from one person to another.  RISKS FACTORS You may be at risk for a URI if:   You smoke.   You have chronic heart or lung disease.  You have a weakened defense (immune) system.   You are very young or very old.   You have nasal allergies or asthma.  You work in crowded or poorly ventilated areas.  You work in health care facilities or schools. SIGNS AND SYMPTOMS  Symptoms typically develop 2-3 days after you come in contact with a cold virus. Most viral URIs last 7-10 days. However, viral URIs from the influenza virus (flu virus) can last 14-18 days and are typically more severe. Symptoms may include:   Runny or stuffy (congested) nose.   Sneezing.   Cough.   Sore throat.   Headache.   Fatigue.   Fever.   Loss of appetite.   Pain in your forehead, behind your eyes, and over your cheekbones (sinus pain).  Muscle aches.  DIAGNOSIS  Your health care provider may diagnose a URI by:  Physical exam.  Tests to check that your symptoms are not due to  another condition such as:  Strep throat.  Sinusitis.  Pneumonia.  Asthma. TREATMENT  A URI goes away on its own with time. It cannot be cured with medicines, but medicines may be prescribed or recommended to relieve symptoms. Medicines may help:  Reduce your fever.  Reduce your cough.  Relieve nasal congestion. HOME CARE INSTRUCTIONS   Take medicines only as directed by your health care provider.   Gargle warm saltwater or take cough drops to comfort your throat as directed by your health care provider.  Use a warm mist humidifier or inhale steam from a shower to increase air moisture. This may make it easier to breathe.  Drink enough fluid to keep your urine clear or pale yellow.   Eat soups and other clear broths and maintain good nutrition.   Rest as needed.   Return to work when your temperature has returned to normal or as your health care provider advises. You may need to stay home longer to avoid infecting others. You can also use a face mask and careful hand washing to prevent spread of the virus.  Increase the usage of your inhaler if you have asthma.   Do not use any tobacco products, including cigarettes, chewing tobacco, or electronic cigarettes. If you need help quitting, ask your health care provider. PREVENTION  The best way to protect yourself from getting a cold is to practice good hygiene.   Avoid oral or hand contact with people with cold   symptoms.   Wash your hands often if contact occurs.  There is no clear evidence that vitamin C, vitamin E, echinacea, or exercise reduces the chance of developing a cold. However, it is always recommended to get plenty of rest, exercise, and practice good nutrition.  SEEK MEDICAL CARE IF:   You are getting worse rather than better.   Your symptoms are not controlled by medicine.   You have chills.  You have worsening shortness of breath.  You have Ickes or red mucus.  You have yellow or Kuzniar nasal  discharge.  You have pain in your face, especially when you bend forward.  You have a fever.  You have swollen neck glands.  You have pain while swallowing.  You have white areas in the back of your throat. SEEK IMMEDIATE MEDICAL CARE IF:   You have severe or persistent:  Headache.  Ear pain.  Sinus pain.  Chest pain.  You have chronic lung disease and any of the following:  Wheezing.  Prolonged cough.  Coughing up blood.  A change in your usual mucus.  You have a stiff neck.  You have changes in your:  Vision.  Hearing.  Thinking.  Mood. MAKE SURE YOU:   Understand these instructions.  Will watch your condition.  Will get help right away if you are not doing well or get worse.   This information is not intended to replace advice given to you by your health care provider. Make sure you discuss any questions you have with your health care provider.   Document Released: 05/21/2001 Document Revised: 04/11/2015 Document Reviewed: 03/02/2014 Elsevier Interactive Patient Education 2016 [employer]  

## 2016-02-12 MED FILL — BENZONATATE 100 MG CAPSULE: 100 | 7 days supply | Qty: 21 | Fill #0

## 2016-02-14 ENCOUNTER — Encounter (HOSPITAL_BASED_OUTPATIENT_CLINIC_OR_DEPARTMENT_OTHER): Payer: Self-pay

## 2016-02-14 ENCOUNTER — Emergency Department (HOSPITAL_BASED_OUTPATIENT_CLINIC_OR_DEPARTMENT_OTHER)
Admission: EM | Admit: 2016-02-14 | Discharge: 2016-02-14 | Disposition: A | Discharge status: Home / Self Care | Payer: Self-pay | Attending: Emergency Medicine | Admitting: Emergency Medicine

## 2016-02-14 ENCOUNTER — Emergency Department (HOSPITAL_BASED_OUTPATIENT_CLINIC_OR_DEPARTMENT_OTHER): Payer: Self-pay

## 2016-02-14 DIAGNOSIS — J209 Acute bronchitis, unspecified: Secondary | ICD-10-CM | POA: Insufficient documentation

## 2016-02-14 DIAGNOSIS — J0110 Acute frontal sinusitis, unspecified: Secondary | ICD-10-CM | POA: Insufficient documentation

## 2016-02-14 DIAGNOSIS — J40 Bronchitis, not specified as acute or chronic: Secondary | ICD-10-CM

## 2016-02-14 DIAGNOSIS — Z79899 Other long term (current) drug therapy: Secondary | ICD-10-CM | POA: Insufficient documentation

## 2016-02-14 MED ORDER — AZITHROMYCIN 250 MG PO TABS: 250.0000 mg | ORAL_TABLET | Freq: Every day | ORAL | Status: DC

## 2016-02-14 MED ORDER — LORATADINE 10 MG PO TABS: 10.0000 mg | ORAL_TABLET | Freq: Every day | ORAL | Status: DC

## 2016-02-14 MED ORDER — OXYMETAZOLINE HCL 0.05 % NA SOLN: 1.0000 | Freq: Two times a day (BID) | NASAL | Status: AC

## 2016-02-14 MED ORDER — MOMETASONE FUROATE 50 MCG/ACT NA SUSP: 2.0000 | Freq: Every day | NASAL | Status: DC

## 2016-02-14 MED FILL — NASAL DECONGESTANT 0.05% SP: 0.05 | 30 days supply | Qty: 15 | Fill #0

## 2016-02-14 NOTE — ED Notes (Signed)
C/o body aches, vomiting-seen here Sunday dx with bronchitis per pr-pt states she is no better and [facility]

## 2016-02-14 NOTE — Discharge Instructions (Signed)
Sinusitis, Adult °Sinusitis is redness, soreness, and inflammation of the paranasal sinuses. Paranasal sinuses are air pockets within the bones of your face. They are located beneath your eyes, in the middle of your forehead, and above your eyes. In healthy paranasal sinuses, mucus is able to drain out, and air is able to circulate through them by way of your nose. However, when your paranasal sinuses are inflamed, mucus and air can become trapped. This can allow bacteria and other germs to grow and cause infection. °Sinusitis can develop quickly and last only a short time (acute) or continue over a long period (chronic). Sinusitis that lasts for more than 12 weeks is considered chronic. °CAUSES °Causes of sinusitis include: °· Allergies. °· Structural abnormalities, such as displacement of the cartilage that separates your nostrils (deviated septum), which can decrease the air flow through your nose and sinuses and affect sinus drainage. °· Functional abnormalities, such as when the small hairs (cilia) that line your sinuses and help remove mucus do not work properly or are not present. °SIGNS AND SYMPTOMS °Symptoms of acute and chronic sinusitis are the same. The primary symptoms are pain and pressure around the affected sinuses. Other symptoms include: °· Upper toothache. °· Earache. °· Headache. °· Bad breath. °· Decreased sense of smell and taste. °· A cough, which worsens when you are lying flat. °· Fatigue. °· Fever. °· Thick drainage from your nose, which often is green and may contain pus (purulent). °· Swelling and warmth over the affected sinuses. °DIAGNOSIS °Your health care provider will perform a physical exam. During your exam, your health care provider may perform any of the following to help determine if you have acute sinusitis or chronic sinusitis: °· Look in your nose for signs of abnormal growths in your nostrils (nasal polyps). °· Tap over the affected sinus to check for signs of  infection. °· View the inside of your sinuses using an imaging device that has a light attached (endoscope). °If your health care provider suspects that you have chronic sinusitis, one or more of the following tests may be recommended: °· Allergy tests. °· Nasal culture. A sample of mucus is taken from your nose, sent to a lab, and screened for bacteria. °· Nasal cytology. A sample of mucus is taken from your nose and examined by your health care provider to determine if your sinusitis is related to an allergy. °TREATMENT °Most cases of acute sinusitis are related to a viral infection and will resolve on their own within 10 days. Sometimes, medicines are prescribed to help relieve symptoms of both acute and chronic sinusitis. These may include pain medicines, decongestants, nasal steroid sprays, or saline sprays. °However, for sinusitis related to a bacterial infection, your health care provider will prescribe antibiotic medicines. These are medicines that will help kill the bacteria causing the infection. °Rarely, sinusitis is caused by a fungal infection. In these cases, your health care provider will prescribe antifungal medicine. °For some cases of chronic sinusitis, surgery is needed. Generally, these are cases in which sinusitis recurs more than 3 times per year, despite other treatments. °HOME CARE INSTRUCTIONS °· Drink plenty of water. Water helps thin the mucus so your sinuses can drain more easily. °· Use a humidifier. °· Inhale steam 3-4 times a day (for example, sit in the bathroom with the shower running). °· Apply a warm, moist washcloth to your face 3-4 times a day, or as directed by your health care provider. °· Use saline nasal sprays to help   moisten and clean your sinuses. °· Take medicines only as directed by your health care provider. °· If you were prescribed either an antibiotic or antifungal medicine, finish it all even if you start to feel better. °SEEK IMMEDIATE MEDICAL CARE IF: °· You have  increasing pain or severe headaches. °· You have nausea, vomiting, or drowsiness. °· You have swelling around your face. °· You have vision problems. °· You have a stiff neck. °· You have difficulty breathing. °  °This information is not intended to replace advice given to you by your health care provider. Make sure you discuss any questions you have with your health care provider. °  °Document Released: 11/25/2005 Document Revised: 12/16/2014 Document Reviewed: 12/10/2011 °Elsevier Interactive Patient Education ©2016 [employer] °Upper Respiratory Infection, Adult °Most upper respiratory infections (URIs) are a viral infection of the air passages leading to the lungs. A URI affects the nose, throat, and upper air passages. The most common type of URI is nasopharyngitis and is typically referred to as "the common cold." °URIs run their course and usually go away on their own. Most of the time, a URI does not require medical attention, but sometimes a bacterial infection in the upper airways can follow a viral infection. This is called a secondary infection. Sinus and middle ear infections are common types of secondary upper respiratory infections. °Bacterial pneumonia can also complicate a URI. A URI can worsen asthma and chronic obstructive pulmonary disease (COPD). Sometimes, these complications can require emergency medical care and may be life threatening.  °CAUSES °Almost all URIs are caused by viruses. A virus is a type of germ and can spread from one person to another.  °RISKS FACTORS °You may be at risk for a URI if:  °· You smoke.   °· You have chronic heart or lung disease. °· You have a weakened defense (immune) system.   °· You are very young or very old.   °· You have nasal allergies or asthma. °· You work in crowded or poorly ventilated areas. °· You work in health care facilities or schools. °SIGNS AND SYMPTOMS  °Symptoms typically develop 2-3 days after you come in contact with a cold virus. Most  viral URIs last 7-10 days. However, viral URIs from the influenza virus (flu virus) can last 14-18 days and are typically more severe. Symptoms may include:  °· Runny or stuffy (congested) nose.   °· Sneezing.   °· Cough.   °· Sore throat.   °· Headache.   °· Fatigue.   °· Fever.   °· Loss of appetite.   °· Pain in your forehead, behind your eyes, and over your cheekbones (sinus pain). °· Muscle aches.   °DIAGNOSIS  °Your health care provider may diagnose a URI by: °· Physical exam. °· Tests to check that your symptoms are not due to another condition such as: °¨ Strep throat. °¨ Sinusitis. °¨ Pneumonia. °¨ Asthma. °TREATMENT  °A URI goes away on its own with time. It cannot be cured with medicines, but medicines may be prescribed or recommended to relieve symptoms. Medicines may help: °· Reduce your fever. °· Reduce your cough. °· Relieve nasal congestion. °HOME CARE INSTRUCTIONS  °· Take medicines only as directed by your health care provider.   °· Gargle warm saltwater or take cough drops to comfort your throat as directed by your health care provider. °· Use a warm mist humidifier or inhale steam from a shower to increase air moisture. This may make it easier to breathe. °· Drink enough fluid to keep your urine clear or   pale yellow.   °· Eat soups and other clear broths and maintain good nutrition.   °· Rest as needed.   °· Return to work when your temperature has returned to normal or as your health care provider advises. You may need to stay home longer to avoid infecting others. You can also use a face mask and careful hand washing to prevent spread of the virus. °· Increase the usage of your inhaler if you have asthma.   °· Do not use any tobacco products, including cigarettes, chewing tobacco, or electronic cigarettes. If you need help quitting, ask your health care provider. °PREVENTION  °The best way to protect yourself from getting a cold is to practice good hygiene.  °· Avoid oral or hand contact with  people with cold symptoms.   °· Wash your hands often if contact occurs.   °There is no clear evidence that vitamin C, vitamin E, echinacea, or exercise reduces the chance of developing a cold. However, it is always recommended to get plenty of rest, exercise, and practice good nutrition.  °SEEK MEDICAL CARE IF:  °· You are getting worse rather than better.   °· Your symptoms are not controlled by medicine.   °· You have chills. °· You have worsening shortness of breath. °· You have Pingree or red mucus. °· You have yellow or Halladay nasal discharge. °· You have pain in your face, especially when you bend forward. °· You have a fever. °· You have swollen neck glands. °· You have pain while swallowing. °· You have white areas in the back of your throat. °SEEK IMMEDIATE MEDICAL CARE IF:  °· You have severe or persistent: °¨ Headache. °¨ Ear pain. °¨ Sinus pain. °¨ Chest pain. °· You have chronic lung disease and any of the following: °¨ Wheezing. °¨ Prolonged cough. °¨ Coughing up blood. °¨ A change in your usual mucus. °· You have a stiff neck. °· You have changes in your: °¨ Vision. °¨ Hearing. °¨ Thinking. °¨ Mood. °MAKE SURE YOU:  °· Understand these instructions. °· Will watch your condition. °· Will get help right away if you are not doing well or get worse. °  °This information is not intended to replace advice given to you by your health care provider. Make sure you discuss any questions you have with your health care provider. °  °Document Released: 05/21/2001 Document Revised: 04/11/2015 Document Reviewed: 03/02/2014 °Elsevier Interactive Patient Education ©2016 [employer] Inc. ° °

## 2016-02-14 NOTE — ED Provider Notes (Signed)
CSN: [id number]     Arrival date & time 02/14/16  1055 History   First MD Initiated Contact with Patient 02/14/16 1214     Chief Complaint  Patient presents with  . Generalized Body Aches     (Consider location/radiation/quality/duration/timing/severity/associated sxs/prior Treatment) HPI Patient presents with cough for several weeks. She's had worsening diffuse myalgias, nasal congestion and sinus pressure over the last few days. She was seen in emergency department several days ago and given albuterol inhaler. States that she thinks the albuterol inhaler makes her coughing worse. She had a fever up to 102 this morning. Patient states she took Tylenol with improvement. Has been exposed to multiple people with the flu at work.  History reviewed. No pertinent past medical history. Past Surgical History  Procedure Laterality Date  . Finger surgery Left     ring finger, couple years ago   No family history on file. Social History  Substance Use Topics  . Smoking status: Never Smoker   . Smokeless tobacco: None  . Alcohol Use: No   OB History    No data available     Review of Systems  Constitutional: Positive for fever, chills and fatigue.  HENT: Positive for congestion, rhinorrhea, sinus pressure and sore throat. Negative for ear pain.   Eyes: Negative for visual disturbance.  Respiratory: Positive for cough and shortness of breath.   Cardiovascular: Negative for chest pain, palpitations and leg swelling.  Gastrointestinal: Negative for nausea, vomiting, abdominal pain, diarrhea and constipation.  Musculoskeletal: Positive for myalgias. Negative for neck pain and neck stiffness.  Skin: Negative for rash and wound.  Neurological: Negative for dizziness, weakness, light-headedness, numbness and headaches.  All other systems reviewed and are negative.     Allergies  Review of patient's allergies indicates no known allergies.  Home Medications   Prior to Admission  medications   Medication Sig Start Date End Date Taking? Authorizing Provider  azithromycin (ZITHROMAX) 250 MG tablet Take 1 tablet (250 mg total) by mouth daily. Take first 2 tablets together, then 1 every day until finished. 02/14/16   Loren Racer, MD  benzonatate (TESSALON) 100 MG capsule Take 1 capsule (100 mg total) by mouth every 8 (eight) hours. 02/11/16   Rolan Bucco, MD  loratadine (CLARITIN) 10 MG tablet Take 1 tablet (10 mg total) by mouth daily. 02/14/16   Loren Racer, MD  mometasone (NASONEX) 50 MCG/ACT nasal spray Place 2 sprays into the nose daily. 02/14/16   Loren Racer, MD  ondansetron (ZOFRAN ODT) 8 MG disintegrating tablet Take 1 tablet (8 mg total) by mouth every 8 (eight) hours as needed for nausea or vomiting. 12/01/15   John Molpus, MD  oxymetazoline (AFRIN NASAL SPRAY) 0.05 % nasal spray Place 1 spray into both nostrils 2 (two) times daily. 02/14/16 02/17/16  Loren Racer, MD  pantoprazole (PROTONIX) 40 MG tablet Take 1 tablet (40 mg total) by mouth daily. 11/15/15   Cheri Fowler, PA-C  ranitidine (ZANTAC) 150 MG tablet Take 1 tablet (150 mg total) by mouth 2 (two) times daily. 11/15/15   Kayla Rose, PA-C   BP 116/62 mmHg  Pulse 98  Temp(Src) 98.7 F (37.1 C) (Oral)  Resp 18  Ht  (1.626 m)  Wt 182 lb (82.555 kg)  BMI 31.22 kg/m2  SpO2 98%  LMP 02/06/2016 (LMP Unknown) Physical Exam  Constitutional: She is oriented to person, place, and time. She appears well-developed and well-nourished. No distress.  HENT:  Head: Normocephalic and atraumatic.  Mouth/Throat: Oropharynx  is clear and moist.  Bilateral nasal mucosal edema. Patient has bilateral frontal sinus tenderness with percussion. Oropharynx is mildly erythematous without tonsillar hypertrophy or exudates. Uvula is midline.  Eyes: EOM are normal. Pupils are equal, round, and reactive to light.  Neck: Normal range of motion. Neck supple.  No meningismus.   Cardiovascular: Normal rate and regular rhythm.   Exam reveals no gallop and no friction rub.   No murmur heard. Pulmonary/Chest: Effort normal. No respiratory distress. She has no wheezes. She has rales.  Basilar Rales bilaterally.  Abdominal: Soft. Bowel sounds are normal. She exhibits no distension and no mass. There is no tenderness. There is no rebound and no guarding.  Musculoskeletal: Normal range of motion. She exhibits no edema or tenderness.  No CVA tenderness. No lower extremity swelling or asymmetry.  Lymphadenopathy:    She has no cervical adenopathy.  Neurological: She is alert and oriented to person, place, and time.  5/5 motor in all extremities. Sensation is fully intact.  Skin: Skin is warm and dry. No rash noted. No erythema.  Psychiatric: She has a normal mood and affect. Her behavior is normal.  Nursing note and vitals reviewed.   ED Course  Procedures (including critical care time) Labs Review Labs Reviewed - No data to display  Imaging Review Dg Chest 2 View  02/14/2016  CLINICAL DATA:  Diagnosed with bronchitis 3 days ago, not improving, cough congestion nausea and vomiting for 5 days EXAM: CHEST  2 VIEW COMPARISON:  02/11/2016 FINDINGS: Heart size and vascular pattern are normal. No infiltrate or effusion. Stable mild bronchitic change. IMPRESSION: Stable mild bronchitic change with no acute findings Electronically Signed   By: Esperanza Heiraymond  Rubner M.D.   On: 02/14/2016 12:53   I have personally reviewed and evaluated these images and lab results as part of my medical decision-making.   EKG Interpretation None      MDM   Final diagnoses:  Acute frontal sinusitis, recurrence not specified  Bronchitis    Patient is very well-appearing. She is in no respiratory distress. Repeat chest x-ray just shows stable bronchitic findings. The patient may likely have sinus infection given nasal congestion and sinus symptoms with percussion. Will start on antihistamine and nasal sprays.    Loren Raceravid Ariba Lehnen, MD 02/14/16  [phone]

## 2017-01-10 ENCOUNTER — Encounter (HOSPITAL_BASED_OUTPATIENT_CLINIC_OR_DEPARTMENT_OTHER): Payer: Self-pay

## 2017-01-10 ENCOUNTER — Emergency Department (HOSPITAL_BASED_OUTPATIENT_CLINIC_OR_DEPARTMENT_OTHER)
Admission: EM | Admit: 2017-01-10 | Discharge: 2017-01-10 | Disposition: A | Discharge status: Home / Self Care | Payer: Medicaid Other | Attending: Emergency Medicine | Admitting: Emergency Medicine

## 2017-01-10 DIAGNOSIS — K047 Periapical abscess without sinus: Secondary | ICD-10-CM | POA: Insufficient documentation

## 2017-01-10 MED ORDER — NAPROXEN 500 MG PO TABS: 500.0000 mg | ORAL_TABLET | Freq: Two times a day (BID) | ORAL | 0 refills | Status: DC

## 2017-01-10 MED ORDER — PENICILLIN V POTASSIUM 500 MG PO TABS: 500.0000 mg | ORAL_TABLET | Freq: Four times a day (QID) | ORAL | 0 refills | Status: DC

## 2017-01-10 MED FILL — NAPROXEN 500 MG TABLET: 500 | 15 days supply | Qty: 30 | Fill #0

## 2017-01-10 MED FILL — PENICILLIN VK 500 MG TABLET: 500 | 10 days supply | Qty: 40 | Fill #0

## 2017-01-10 NOTE — Discharge Instructions (Signed)
Take the prescribed medication as directed.   Follow-up with a dentist as soon as possible to ensure this heals appropriately. Return to the ED for new or worsening symptoms.

## 2017-01-10 NOTE — ED Provider Notes (Signed)
[facility] Provider Note   CSN: [id number] Arrival date & time: 01/10/17  1054     History   Chief Complaint Chief Complaint  Patient presents with  . Dental Pain    HPI Shelia Richards is a 21 y.o. female.  The history is provided by the patient and medical records.  Dental Pain      21 year old female here with right upper dental pain. She reports is been ongoing for about a month now, but got worse over the past week. She reports she saw a "bump" on her gums and became concerned. She's not had any fever. States she does have some pain in the right side of her face. No difficulty swallowing. She is tried taking Tylenol without relief. Patient is not currently status with a dentist as she reports her dental insurance lacks.  History reviewed. No pertinent past medical history.  There are no active problems to display for this patient.   Past Surgical History:  Procedure Laterality Date  . FINGER SURGERY Left    ring finger, couple years ago    OB History    No data available       Home Medications    Prior to Admission medications   Medication Sig Start Date End Date Taking? Authorizing Provider  azithromycin (ZITHROMAX) 250 MG tablet Take 1 tablet (250 mg total) by mouth daily. Take first 2 tablets together, then 1 every day until finished. 02/14/16   Loren Raceravid Yelverton, MD  benzonatate (TESSALON) 100 MG capsule Take 1 capsule (100 mg total) by mouth every 8 (eight) hours. 02/11/16   Rolan BuccoMelanie Belfi, MD  loratadine (CLARITIN) 10 MG tablet Take 1 tablet (10 mg total) by mouth daily. 02/14/16   Loren Raceravid Yelverton, MD  mometasone (NASONEX) 50 MCG/ACT nasal spray Place 2 sprays into the nose daily. 02/14/16   Loren Raceravid Yelverton, MD  ondansetron (ZOFRAN ODT) 8 MG disintegrating tablet Take 1 tablet (8 mg total) by mouth every 8 (eight) hours as needed for nausea or vomiting. 12/01/15   John Molpus, MD  pantoprazole (PROTONIX) 40 MG tablet Take 1 tablet (40 mg total) by mouth  daily. 11/15/15   Cheri FowlerKayla Rose, PA-C  ranitidine (ZANTAC) 150 MG tablet Take 1 tablet (150 mg total) by mouth 2 (two) times daily. 11/15/15   Cheri FowlerKayla Rose, PA-C    Family History No family history on file.  Social History Social History  Substance Use Topics  . Smoking status: Never Smoker  . Smokeless tobacco: Never Used  . Alcohol use No     Allergies   Patient has no known allergies.   Review of Systems Review of Systems  HENT: Positive for dental problem.   All other systems reviewed and are negative.    Physical Exam Updated Vital Signs BP 103/66 (BP Location: Left Arm)   Pulse 70   Temp 98.1 F (36.7 C) (Oral)   Resp 16   Ht 5\' 4"  (1.626 m)   Wt 85.7 kg   LMP 01/08/2017   SpO2 100%   BMI 32.44 kg/m   Physical Exam  Constitutional: She is oriented to person, place, and time. She appears well-developed and well-nourished.  HENT:  Head: Normocephalic and atraumatic.  Mouth/Throat: Oropharynx is clear and moist.  Teeth largely in fair dentition, gingiva surrounding right upper lateral incisor has evidence of small abscess but tooth appears normal without decay, handling secretions appropriately, no trismus, no facial or neck swelling, normal phonation without stridor  Eyes: Conjunctivae and EOM are  normal. Pupils are equal, round, and reactive to light.  Neck: Normal range of motion.  Cardiovascular: Normal rate, regular rhythm and normal heart sounds.   Pulmonary/Chest: Effort normal and breath sounds normal. No respiratory distress. She has no wheezes.  Abdominal: Soft. Bowel sounds are normal. There is no tenderness. There is no rebound.  Musculoskeletal: Normal range of motion.  Neurological: She is alert and oriented to person, place, and time.  Skin: Skin is warm and dry.  Psychiatric: She has a normal mood and affect.  Nursing note and vitals reviewed.    ED Treatments / Results  Labs (all labs ordered are listed, but only abnormal results are  displayed) Labs Reviewed - No data to display  EKG  EKG Interpretation None       Radiology No results found.  Procedures Procedures (including critical care time)  Medications Ordered in ED Medications - No data to display   Initial Impression / Assessment and Plan / ED Course  I have reviewed the triage vital signs and the nursing notes.  Pertinent labs & imaging results that were available during my care of the patient were reviewed by me and considered in my medical decision making (see chart for details).  21 year old female. Right upper dental pain. Present for the past month, worsening over the past week. On exam she appears to have a small abscess above her right upper lateral incisor. Tooth is overall normal in appearance. She has no facial or neck swelling. She is handling her secretions well, normal phonation without stridor. Not clinically concerning for Ludwig's angina. Will plan to start on antibiotics and have her follow-up with dentist.  Discussed plan with patient, she acknowledged understanding and agreed with plan of care.  Return precautions given for new or worsening symptoms.  Final Clinical Impressions(s) / ED Diagnoses   Final diagnoses:  Dental abscess    New Prescriptions New Prescriptions   NAPROXEN (NAPROSYN) 500 MG TABLET    Take 1 tablet (500 mg total) by mouth 2 (two) times daily with a meal.   PENICILLIN V POTASSIUM (VEETID) 500 MG TABLET    Take 1 tablet (500 mg total) by mouth 4 (four) times daily.     Garlon Hatchet, PA-C 01/10/17 1136    Vanetta Mulders, MD 01/16/17 [phone]

## 2017-01-10 NOTE — ED Triage Notes (Signed)
C/o pain to gums and teeth x 1 month-NAD-steady gait

## 2017-05-12 ENCOUNTER — Emergency Department (HOSPITAL_BASED_OUTPATIENT_CLINIC_OR_DEPARTMENT_OTHER): Payer: Self-pay

## 2017-05-12 ENCOUNTER — Emergency Department (HOSPITAL_BASED_OUTPATIENT_CLINIC_OR_DEPARTMENT_OTHER)
Admission: EM | Admit: 2017-05-12 | Discharge: 2017-05-12 | Disposition: A | Discharge status: Home / Self Care | Payer: Self-pay | Attending: Emergency Medicine | Admitting: Emergency Medicine

## 2017-05-12 ENCOUNTER — Encounter (HOSPITAL_BASED_OUTPATIENT_CLINIC_OR_DEPARTMENT_OTHER): Payer: Self-pay | Admitting: *Deleted

## 2017-05-12 DIAGNOSIS — R41840 Attention and concentration deficit: Secondary | ICD-10-CM | POA: Insufficient documentation

## 2017-05-12 DIAGNOSIS — R6889 Other general symptoms and signs: Secondary | ICD-10-CM

## 2017-05-12 DIAGNOSIS — R51 Headache: Secondary | ICD-10-CM | POA: Insufficient documentation

## 2017-05-12 DIAGNOSIS — R519 Headache, unspecified: Secondary | ICD-10-CM

## 2017-05-12 HISTORY — DX: Bronchitis, not specified as acute or chronic: J40

## 2017-05-12 LAB — CBC WITH DIFFERENTIAL/PLATELET
Basophils Absolute: 0 10*3/uL (ref 0.0–0.1)
Basophils Relative: 0 %
EOS PCT: 1 %
Eosinophils Absolute: 0.1 10*3/uL (ref 0.0–0.7)
HEMATOCRIT: 37.2 % (ref 36.0–46.0)
Hemoglobin: 12.7 g/dL (ref 12.0–15.0)
LYMPHS ABS: 3.3 10*3/uL (ref 0.7–4.0)
LYMPHS PCT: 41 %
MCH: 28.8 pg (ref 26.0–34.0)
MCHC: 34.1 g/dL (ref 30.0–36.0)
MCV: 84.4 fL (ref 78.0–100.0)
Monocytes Absolute: 0.4 10*3/uL (ref 0.1–1.0)
Monocytes Relative: 5 %
NEUTROS ABS: 4.3 10*3/uL (ref 1.7–7.7)
Neutrophils Relative %: 53 %
Platelets: 326 10*3/uL (ref 150–400)
RBC: 4.41 MIL/uL (ref 3.87–5.11)
RDW: 12.8 % (ref 11.5–15.5)
WBC: 8.2 10*3/uL (ref 4.0–10.5)

## 2017-05-12 LAB — COMPREHENSIVE METABOLIC PANEL
ALK PHOS: 55 U/L (ref 38–126)
ALT: 14 U/L (ref 14–54)
AST: 19 U/L (ref 15–41)
Albumin: 4 g/dL (ref 3.5–5.0)
Anion gap: 6 (ref 5–15)
BILIRUBIN TOTAL: 0.6 mg/dL (ref 0.3–1.2)
BUN: 8 mg/dL (ref 6–20)
CALCIUM: 9.1 mg/dL (ref 8.9–10.3)
CO2: 27 mmol/L (ref 22–32)
CREATININE: 0.8 mg/dL (ref 0.44–1.00)
Chloride: 105 mmol/L (ref 101–111)
Glucose, Bld: 97 mg/dL (ref 65–99)
Potassium: 3.7 mmol/L (ref 3.5–5.1)
Sodium: 138 mmol/L (ref 135–145)
TOTAL PROTEIN: 7.6 g/dL (ref 6.5–8.1)

## 2017-05-12 LAB — URINALYSIS, ROUTINE W REFLEX MICROSCOPIC
BILIRUBIN URINE: NEGATIVE
Glucose, UA: NEGATIVE mg/dL
HGB URINE DIPSTICK: NEGATIVE
Ketones, ur: NEGATIVE mg/dL
Leukocytes, UA: NEGATIVE
NITRITE: NEGATIVE
PH: 6 (ref 5.0–8.0)
Protein, ur: NEGATIVE mg/dL
SPECIFIC GRAVITY, URINE: 1.027 (ref 1.005–1.030)

## 2017-05-12 LAB — PREGNANCY, URINE: PREG TEST UR: NEGATIVE

## 2017-05-12 MED ORDER — METOCLOPRAMIDE HCL 5 MG/ML IJ SOLN
10.0000 mg | Freq: Once | INTRAMUSCULAR | Status: AC
  Administered 2017-05-12: 10 mg via INTRAVENOUS
  Filled 2017-05-12: qty 2

## 2017-05-12 MED ORDER — DIPHENHYDRAMINE HCL 50 MG/ML IJ SOLN
25.0000 mg | Freq: Once | INTRAMUSCULAR | Status: AC
  Administered 2017-05-12: 25 mg via INTRAVENOUS
  Filled 2017-05-12: qty 1

## 2017-05-12 MED ORDER — SODIUM CHLORIDE 0.9 % IV BOLUS (SEPSIS)
1000.0000 mL | Freq: Once | INTRAVENOUS | Status: AC
  Administered 2017-05-12: 1000 mL via INTRAVENOUS

## 2017-05-12 NOTE — ED Notes (Signed)
MD at bedside discussing results with patient at this time. 

## 2017-05-12 NOTE — ED Provider Notes (Signed)
[facility] Provider Note: Lowella DellJ. Lane Keithen Capo, MD, FACEP  CSN: [id number] MRN: [id number] ARRIVAL: 05/12/17 at 0617 ROOM: [facility]02/[facility]02   CHIEF COMPLAINT  Headache   HISTORY OF PRESENT ILLNESS  Shelia Richards is a 21 y.o. female with no personal or family history of migraine headaches. She is here with headaches and body aches for the past month. The headaches occur in the right temporal area. She rates her headache as a 9 presently. There is no specific pattern to the headaches. They may happen several times a day or there are days where she is headache free. There is no specific trigger for them. There has been associated nausea but no vomiting. She has associated photophobia. She has also had episodes where she "stares into space" for a couple of seconds. Friends of stated that it appeared she was not paying attention. She relates having 2 of these episodes on the drive over here this morning. She denies fever, chills, sore throat, nasal congestion, cough, shortness of breath, abdominal pain, vaginal bleeding, vaginal discharge, dysuria or hematuria. She is reliant on laxatives to have bowel movements. She has taken multiple over-the-counter analgesics without relief of her headaches.   Past Medical History:  Diagnosis Date  . Bronchitis     Past Surgical History:  Procedure Laterality Date  . FINGER SURGERY Left    ring finger, couple years ago    No family history on file.  Social History  Substance Use Topics  . Smoking status: Never Smoker  . Smokeless tobacco: Never Used  . Alcohol use Yes    Prior to Admission medications   Not on File    Allergies Patient has no known allergies.   REVIEW OF SYSTEMS  Negative except as noted here or in the History of Present Illness.   PHYSICAL EXAMINATION  Initial Vital Signs Blood pressure 115/65, pulse 76, temperature 97.9 F (36.6 C), temperature source Oral, resp. rate 18, height 5\' 3"  (1.6 m), weight 81.2 kg (179  lb), last menstrual period 04/16/2017, SpO2 100 %.  Examination General: Well-developed, well-nourished female in no acute distress; appearance consistent with age of record HENT: normocephalic; atraumatic Eyes: pupils equal, round and reactive to light; extraocular muscles intact Neck: supple Heart: regular rate and rhythm Lungs: clear to auscultation bilaterally Abdomen: soft; nondistended; nontender; no masses or hepatosplenomegaly; bowel sounds present Extremities: No deformity; full range of motion; pulses normal Neurologic: Awake, alert and oriented; motor function intact in all extremities and symmetric; no facial droop; normal coordination, speech and gait; negative Romberg; normal finger to nose Skin: Warm and dry Psychiatric: Normal mood and affect   RESULTS  Summary of this visit's results, reviewed by myself:   EKG Interpretation  Date/Time:  Monday May 12 2017 07:30:31 EDT Ventricular Rate:  82 PR Interval:    QRS Duration: 88 QT Interval:  395 QTC Calculation: 462 R Axis:   60 Text Interpretation:  Sinus rhythm No previous ECGs available Confirmed by [facility] Center[facility] MD, Denny PeonERIN ([id number]) on 05/12/2017 7:50:07 AM Also confirmed by [facility], ERIN ([id number]), editor Rollen SoxStout, Marilyn [id number])  on 05/12/2017 8:13:48 AM      Laboratory Studies: Results for orders placed or performed during the hospital encounter of 05/12/17 (from the past 24 hour(s))  Urinalysis, Routine w reflex microscopic     Status: None   Collection Time: 05/12/17  6:29 AM  Result Value Ref Range   Color, Urine YELLOW YELLOW   APPearance CLEAR CLEAR   Specific Gravity, Urine 1.027 1.005 -  1.030   pH 6.0 5.0 - 8.0   Glucose, UA NEGATIVE NEGATIVE mg/dL   Hgb urine dipstick NEGATIVE NEGATIVE   Bilirubin Urine NEGATIVE NEGATIVE   Ketones, ur NEGATIVE NEGATIVE mg/dL   Protein, ur NEGATIVE NEGATIVE mg/dL   Nitrite NEGATIVE NEGATIVE   Leukocytes, UA NEGATIVE NEGATIVE  Pregnancy, urine     Status: None    Collection Time: 05/12/17  6:29 AM  Result Value Ref Range   Preg Test, Ur NEGATIVE NEGATIVE  CBC with Differential/Platelet     Status: None   Collection Time: 05/12/17  7:00 AM  Result Value Ref Range   WBC 8.2 4.0 - 10.5 K/uL   RBC 4.41 3.87 - 5.11 MIL/uL   Hemoglobin 12.7 12.0 - 15.0 g/dL   HCT 96.0 45.4 - 09.8 %   MCV 84.4 78.0 - 100.0 fL   MCH 28.8 26.0 - 34.0 pg   MCHC 34.1 30.0 - 36.0 g/dL   RDW 11.9 14.7 - 82.9 %   Platelets 326 150 - 400 K/uL   Neutrophils Relative % 53 %   Neutro Abs 4.3 1.7 - 7.7 K/uL   Lymphocytes Relative 41 %   Lymphs Abs 3.3 0.7 - 4.0 K/uL   Monocytes Relative 5 %   Monocytes Absolute 0.4 0.1 - 1.0 K/uL   Eosinophils Relative 1 %   Eosinophils Absolute 0.1 0.0 - 0.7 K/uL   Basophils Relative 0 %   Basophils Absolute 0.0 0.0 - 0.1 K/uL  Comprehensive metabolic panel     Status: None   Collection Time: 05/12/17  7:00 AM  Result Value Ref Range   Sodium 138 135 - 145 mmol/L   Potassium 3.7 3.5 - 5.1 mmol/L   Chloride 105 101 - 111 mmol/L   CO2 27 22 - 32 mmol/L   Glucose, Bld 97 65 - 99 mg/dL   BUN 8 6 - 20 mg/dL   Creatinine, Ser 5.62 0.44 - 1.00 mg/dL   Calcium 9.1 8.9 - 13.0 mg/dL   Total Protein 7.6 6.5 - 8.1 g/dL   Albumin 4.0 3.5 - 5.0 g/dL   AST 19 15 - 41 U/L   ALT 14 14 - 54 U/L   Alkaline Phosphatase 55 38 - 126 U/L   Total Bilirubin 0.6 0.3 - 1.2 mg/dL   GFR calc non Af Amer >60 >60 mL/min   GFR calc Af Amer >60 >60 mL/min   Anion gap 6 5 - 15   Imaging Studies: Ct Head Wo Contrast  Result Date: 05/12/2017 CLINICAL DATA:  Frontal headache and dizziness. EXAM: CT HEAD WITHOUT CONTRAST TECHNIQUE: Contiguous axial images were obtained from the base of the skull through the vertex without intravenous contrast. COMPARISON:  None. FINDINGS: Brain: No evidence of acute infarction, hemorrhage, hydrocephalus, extra-axial collection or mass lesion/mass effect. Vascular: No hyperdense vessel or unexpected calcification. Skull: Normal.  Negative for fracture or focal lesion. Sinuses/Orbits: No acute finding. Other: None. IMPRESSION: Normal head CT. Electronically Signed   By: Irish Lack M.D.   On: 05/12/2017 07:44    ED COURSE  Nursing notes and initial vitals signs, including pulse oximetry, reviewed.  Vitals:   05/12/17 0635 05/12/17 0817  BP: 115/65 (!) 108/54  Pulse: 76 84  Resp: 18 16  Temp: 97.9 F (36.6 C)   TempSrc: Oral   SpO2: 100% 99%  Weight: 81.2 kg (179 lb)   Height: 5\' 3"  (1.6 m)    7:01 AM The patient's symptoms are concerning for new onset migraines  and possible petit mal seizures. We'll obtain a CT of the head and other diagnostic studies. Dr. Dalene Seltzer will follow up on results and make disposition.  PROCEDURES    ED DIAGNOSES     ICD-9-CM ICD-10-CM   1. Acute nonintractable headache, unspecified headache type, suspect migraine 784.0 R51   2. Spells of decreased attentiveness 780.99 R68.89        Kato Wieczorek, Jonny Ruiz, MD 05/12/17 1146

## 2017-05-12 NOTE — ED Provider Notes (Signed)
Received care of patient at 7 AM from Dr. Buena IrishMalta's. Please see his note for prior history, physical and care. Briefly this is a 21 year old female who presents with concern for headache and episodes of staring. Headaches began slowly, low suspicion this represents subarachnoid hemorrhage.  EKG shows no Brugada, no delta waves, no prolonged QTc, no acute ST changes, and shows normal sinus rhythm. There is no family history of sudden death. Episodes described by patient are not clearly syncope.  Labs show no significant Y abnormalities, no anemia, and a negative pregnancy test. She does not describe symptoms that clearly represent Seizures, However Given Episodes of Brief Staring and Alteration of Consciousness May Be Consistent with This. No Postictal Period Described. Patient also describes that episodes of falling asleep. Reports they only happen during headaches, and given headache with photophobia, slow onset, these may represent atypical migraines.  Recommend patient not drive until follow up, particularly when she has headaches as she reports this is the only time she develops these staring episodes.  Given number for Neurology for evaluation of spells and headaches.     Shelia Richards, Shelia Nall, MD 05/12/17 [phone]

## 2017-05-12 NOTE — ED Notes (Signed)
Pt transported to CT at this time.

## 2017-05-12 NOTE — ED Notes (Signed)
Dr. Molpus into room 

## 2017-05-12 NOTE — ED Triage Notes (Addendum)
C/o Ha and body aches. Ongoing for ~ 1 month. States, "usually works out, mild to moderate, nothing extreme, some cardio", "but I feel more sore than usual". Also mentions some light headedness, and transient nausea. States, "I also fell asleep twice while driving here this morning".  Denies recent illness or sick contacts. No relief with acetaminophen ES or NSAIDs.  Alert, NAD, calm, interactive, resps e/u, speaking in clear complete sentences, no dyspnea noted, skin W&D, VSS, (denies: sob, cough, congestion, cold sx, fever, nvd, bleeding, dizziness or visual changes). EDP into room upon arrival. 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   PCP is Shelia Caraseresa [facility]([place]), "but have not seen her in a while".

## 2017-06-24 ENCOUNTER — Encounter (HOSPITAL_BASED_OUTPATIENT_CLINIC_OR_DEPARTMENT_OTHER): Payer: Self-pay | Admitting: Emergency Medicine

## 2017-06-24 ENCOUNTER — Emergency Department (HOSPITAL_BASED_OUTPATIENT_CLINIC_OR_DEPARTMENT_OTHER)
Admission: EM | Admit: 2017-06-24 | Discharge: 2017-06-24 | Disposition: A | Discharge status: Home / Self Care | Payer: Medicaid Other | Attending: Emergency Medicine | Admitting: Emergency Medicine

## 2017-06-24 DIAGNOSIS — R51 Headache: Secondary | ICD-10-CM | POA: Insufficient documentation

## 2017-06-24 DIAGNOSIS — R519 Headache, unspecified: Secondary | ICD-10-CM

## 2017-06-24 LAB — CBC WITH DIFFERENTIAL/PLATELET
Basophils Absolute: 0 10*3/uL (ref 0.0–0.1)
Basophils Relative: 0 %
EOS ABS: 0 10*3/uL (ref 0.0–0.7)
Eosinophils Relative: 0 %
HCT: 39 % (ref 36.0–46.0)
HEMOGLOBIN: 13.2 g/dL (ref 12.0–15.0)
Lymphocytes Relative: 29 %
Lymphs Abs: 2.9 10*3/uL (ref 0.7–4.0)
MCH: 29.1 pg (ref 26.0–34.0)
MCHC: 33.8 g/dL (ref 30.0–36.0)
MCV: 86.1 fL (ref 78.0–100.0)
MONOS PCT: 7 %
Monocytes Absolute: 0.7 10*3/uL (ref 0.1–1.0)
NEUTROS PCT: 64 %
Neutro Abs: 6.3 10*3/uL (ref 1.7–7.7)
PLATELETS: 332 10*3/uL (ref 150–400)
RBC: 4.53 MIL/uL (ref 3.87–5.11)
RDW: 13.6 % (ref 11.5–15.5)
WBC: 9.9 10*3/uL (ref 4.0–10.5)

## 2017-06-24 LAB — BASIC METABOLIC PANEL
Anion gap: 9 (ref 5–15)
BUN: 10 mg/dL (ref 6–20)
CALCIUM: 9.3 mg/dL (ref 8.9–10.3)
CO2: 30 mmol/L (ref 22–32)
CREATININE: 0.89 mg/dL (ref 0.44–1.00)
Chloride: 102 mmol/L (ref 101–111)
Glucose, Bld: 102 mg/dL — ABNORMAL HIGH (ref 65–99)
Potassium: 3.6 mmol/L (ref 3.5–5.1)
SODIUM: 141 mmol/L (ref 135–145)

## 2017-06-24 MED ORDER — SODIUM CHLORIDE 0.9 % IV BOLUS (SEPSIS)
1000.0000 mL | Freq: Once | INTRAVENOUS | Status: AC
  Administered 2017-06-24: 1000 mL via INTRAVENOUS

## 2017-06-24 MED ORDER — DEXAMETHASONE SODIUM PHOSPHATE 10 MG/ML IJ SOLN
10.0000 mg | Freq: Once | INTRAMUSCULAR | Status: AC
  Administered 2017-06-24: 10 mg via INTRAVENOUS
  Filled 2017-06-24: qty 1

## 2017-06-24 MED ORDER — DIPHENHYDRAMINE HCL 50 MG/ML IJ SOLN
25.0000 mg | Freq: Once | INTRAMUSCULAR | Status: AC
  Administered 2017-06-24: 25 mg via INTRAVENOUS
  Filled 2017-06-24: qty 1

## 2017-06-24 MED ORDER — METOCLOPRAMIDE HCL 5 MG/ML IJ SOLN
10.0000 mg | Freq: Once | INTRAMUSCULAR | Status: AC
  Administered 2017-06-24: 10 mg via INTRAVENOUS
  Filled 2017-06-24: qty 2

## 2017-06-24 MED ORDER — ONDANSETRON 4 MG PO TBDP: 4.0000 mg | ORAL_TABLET | Freq: Three times a day (TID) | ORAL | 0 refills | Status: DC | PRN

## 2017-06-24 MED ORDER — KETOROLAC TROMETHAMINE 15 MG/ML IJ SOLN
15.0000 mg | Freq: Once | INTRAMUSCULAR | Status: AC
  Administered 2017-06-24: 15 mg via INTRAVENOUS
  Filled 2017-06-24: qty 1

## 2017-06-24 NOTE — ED Notes (Signed)
Family member came out looking for a nurse stating that the patient could not breath. RN went in and check on patient. Pt was dry heaving and hyperventilating. RN comforted patient. MD at bedside currently

## 2017-06-24 NOTE — ED Provider Notes (Signed)
[facility]   CSN: [id number] Arrival date & time: 06/24/17  2004  By signing my name below, I, Shelia Richards, attest that this documentation has been prepared under the direction and in the presence of Pricilla LovelessGoldston, Shemica Meath, MD. Electronically Signed: Rosana Fretana Richards, ED Scribe. 06/24/17. 9:25 PM.  History   Chief Complaint Chief Complaint  Patient presents with  . Headache   The history is provided by the patient. No language interpreter was used.   HPI Comments: Shelia Richards is a otherwise healthy 21 y.o. female who presents to the Emergency Department complaining of a gradually  frontal, worsening headache onset 9 hours ago. No injury or LOC. No hx of migraines. Pt describes pain as sharp. Pt reports associated photophobia with blurry vision, vomiting and fatigue (diffuse weakness). Pt has tried Tylenol with no relief. Pt was seen in the ED 1 month ago with a headache but notes that the pain was in the sides of her head then although otherwise similar. Pt denies fever, neck pain, numbness or any other complaints at this time.  Past Medical History:  Diagnosis Date  . Bronchitis     There are no active problems to display for this patient.   Past Surgical History:  Procedure Laterality Date  . FINGER SURGERY Left    ring finger, couple years ago    OB History    No data available       Home Medications    Prior to Admission medications   Medication Sig Start Date End Date Taking? Authorizing Provider  ondansetron (ZOFRAN ODT) 4 MG disintegrating tablet Take 1 tablet (4 mg total) by mouth every 8 (eight) hours as needed for nausea or vomiting. 06/24/17   Pricilla LovelessGoldston, Ashby Moskal, MD    Family History No family history on file.  Social History Social History  Substance Use Topics  . Smoking status: Never Smoker  . Smokeless tobacco: Never Used  . Alcohol use Yes     Comment: occ wine     Allergies   Patient has no known allergies.   Review of  Systems Review of Systems  Constitutional: Positive for fatigue. Negative for fever.  Eyes: Positive for photophobia and visual disturbance.  Gastrointestinal: Positive for vomiting.  Musculoskeletal: Negative for neck pain.  Neurological: Positive for headaches. Negative for syncope and numbness.  All other systems reviewed and are negative.    Physical Exam Updated Vital Signs BP 137/64   Pulse (!) 106 Comment: she is crying and texting at triage  Temp 98.5 F (36.9 C) (Oral)   Resp 16   Ht 5\' 3"  (1.6 m)   Wt 79.4 kg (175 lb)   LMP 06/04/2017 (Approximate)   SpO2 100%   BMI 31.00 kg/m   Physical Exam  Constitutional: She is oriented to person, place, and time. She appears well-developed and well-nourished.  Actively vomiting  HENT:  Head: Normocephalic and atraumatic.  Right Ear: External ear normal.  Left Ear: External ear normal.  Nose: Nose normal.  Eyes: Pupils are equal, round, and reactive to light. EOM are normal. Right eye exhibits no discharge. Left eye exhibits no discharge.  Mild photophobia.  Neck: Normal range of motion. Neck supple.  Cardiovascular: Regular rhythm and normal heart sounds.   Pulmonary/Chest: Effort normal and breath sounds normal.  Abdominal: Soft. There is no tenderness.  Neurological: She is alert and oriented to person, place, and time.  CN 3-12 grossly intact. 5/5 strength in all 4 extremities. Grossly normal sensation.  Normal finger to nose.   Skin: Skin is warm and dry.  Nursing note and vitals reviewed.    ED Treatments / Results  DIAGNOSTIC STUDIES: Oxygen Saturation is 100% on RA, normal by my interpretation.   COORDINATION OF CARE: 9:22 PM-Discussed next steps with pt including IV fluids and medications. Pt verbalized understanding and is agreeable with the plan.   Labs (all labs ordered are listed, but only abnormal results are displayed) Labs Reviewed  BASIC METABOLIC PANEL - Abnormal; Notable for the following:        Result Value   Glucose, Bld 102 (*)    All other components within normal limits  CBC WITH DIFFERENTIAL/PLATELET    EKG  EKG Interpretation None       Radiology No results found.  Procedures Procedures (including critical care time)  Medications Ordered in ED Medications  sodium chloride 0.9 % bolus 1,000 mL (0 mLs Intravenous Stopped 06/24/17 2220)  metoCLOPramide (REGLAN) injection 10 mg (10 mg Intravenous Given 06/24/17 2153)  diphenhydrAMINE (BENADRYL) injection 25 mg (25 mg Intravenous Given 06/24/17 2153)  dexamethasone (DECADRON) injection 10 mg (10 mg Intravenous Given 06/24/17 2153)  ketorolac (TORADOL) 15 MG/ML injection 15 mg (15 mg Intravenous Given 06/24/17 2153)     Initial Impression / Assessment and Plan / ED Course  I have reviewed the triage vital signs and the nursing notes.  Pertinent labs & imaging results that were available during my care of the patient were reviewed by me and considered in my medical decision making (see chart for details).     After treatment with fluids and headache medicine, the patient is feeling much better. Her pain is now mild. This was a gradually worsening headache and on further history, she states she's been having headaches that were not quite this bad for several months. She gets about twice a week. This one seemed to last longer but otherwise was not really different. There is no nuchal rigidity, fevers, etc. Her blurry vision and photophobia resolved with treatment. This is likely some sort of migraine variant. I have discussed that she is a fall with neurology given these chronic headaches. CT was obtained last month and unremarkable I do not think repeat imaging or emergent MRI would be an official or needed tonight. Discussed return precautions.  Final Clinical Impressions(s) / ED Diagnoses   Final diagnoses:  Frontal headache    New Prescriptions Discharge Medication List as of 06/24/2017 10:20 PM    START taking  these medications   Details  ondansetron (ZOFRAN ODT) 4 MG disintegrating tablet Take 1 tablet (4 mg total) by mouth every 8 (eight) hours as needed for nausea or vomiting., Starting Tue 06/24/2017, Print       I personally performed the services described in this documentation, which was scribed in my presence. The recorded information has been reviewed and is accurate.     Pricilla Loveless, MD 06/24/17 [phone]

## 2017-06-24 NOTE — ED Notes (Signed)
Was preparing the procedure to place IV and pt started hyperventilating and panicking over the thought of the IV. No IV attempted at this time. Lights dimmed for pt and pt allowed to rest.

## 2017-06-24 NOTE — ED Triage Notes (Signed)
Intermittent HA all day in forehead and around eyes.  Episode of violent vomiting 3 hrs ago.  Took Tylenol 2 hrs ago but it did not help.

## 2017-08-08 ENCOUNTER — Emergency Department (HOSPITAL_BASED_OUTPATIENT_CLINIC_OR_DEPARTMENT_OTHER)
Admission: EM | Admit: 2017-08-08 | Discharge: 2017-08-08 | Disposition: A | Discharge status: Home / Self Care | Payer: Medicaid Other | Attending: Emergency Medicine | Admitting: Emergency Medicine

## 2017-08-08 ENCOUNTER — Encounter (HOSPITAL_BASED_OUTPATIENT_CLINIC_OR_DEPARTMENT_OTHER): Payer: Self-pay | Admitting: *Deleted

## 2017-08-08 DIAGNOSIS — R51 Headache: Secondary | ICD-10-CM | POA: Insufficient documentation

## 2017-08-08 DIAGNOSIS — Z79899 Other long term (current) drug therapy: Secondary | ICD-10-CM | POA: Insufficient documentation

## 2017-08-08 DIAGNOSIS — R519 Headache, unspecified: Secondary | ICD-10-CM

## 2017-08-08 MED ORDER — METOCLOPRAMIDE HCL 10 MG PO TABS
10.0000 mg | ORAL_TABLET | Freq: Once | ORAL | Status: AC
  Administered 2017-08-08: 10 mg via ORAL
  Filled 2017-08-08: qty 1

## 2017-08-08 MED ORDER — ALBUTEROL SULFATE HFA 108 (90 BASE) MCG/ACT IN AERS: 1.0000 | INHALATION_SPRAY | Freq: Four times a day (QID) | RESPIRATORY_TRACT | 0 refills | Status: DC | PRN

## 2017-08-08 MED ORDER — IBUPROFEN 400 MG PO TABS
600.0000 mg | ORAL_TABLET | Freq: Once | ORAL | Status: AC
  Administered 2017-08-08: 600 mg via ORAL
  Filled 2017-08-08: qty 1

## 2017-08-08 NOTE — ED Notes (Signed)
ED Provider at bedside. 

## 2017-08-08 NOTE — ED Triage Notes (Signed)
Pt reports intermittent headaches for several months. Reports receiving migraine cocktail in the past and doesn't want that d/t the drowsiness it causes. Denies known injury, n/v, fever. Reports associated light sensitivity and blurry vision with headaches. Denies pain meds PTA.

## 2017-08-13 ENCOUNTER — Encounter (HOSPITAL_BASED_OUTPATIENT_CLINIC_OR_DEPARTMENT_OTHER): Payer: Self-pay | Admitting: Emergency Medicine

## 2017-08-13 ENCOUNTER — Emergency Department (HOSPITAL_BASED_OUTPATIENT_CLINIC_OR_DEPARTMENT_OTHER)
Admission: EM | Admit: 2017-08-13 | Discharge: 2017-08-13 | Disposition: A | Discharge status: Home / Self Care | Payer: Self-pay | Attending: Emergency Medicine | Admitting: Emergency Medicine

## 2017-08-13 DIAGNOSIS — Z79899 Other long term (current) drug therapy: Secondary | ICD-10-CM | POA: Insufficient documentation

## 2017-08-13 DIAGNOSIS — K0889 Other specified disorders of teeth and supporting structures: Secondary | ICD-10-CM | POA: Insufficient documentation

## 2017-08-13 MED ORDER — PENICILLIN V POTASSIUM 500 MG PO TABS: 500.0000 mg | ORAL_TABLET | Freq: Four times a day (QID) | ORAL | 0 refills | Status: AC

## 2017-08-13 NOTE — Discharge Instructions (Signed)
It was my pleasure taking care of you today!   It is very important that you get evaluated by a dentist as soon as possible. Call tomorrow to schedule an appointment. Alternate between tylenol and Ibuprofen as needed for pain. Take your full course of antibiotics. Read the instructions below.  Eat a soft or liquid diet and rinse your mouth out after meals with warm water. You should see a dentist or return here at once if you have increased swelling, increased pain or uncontrolled bleeding from the site of your injury.  SEEK MEDICAL CARE IF:  You have increased pain not controlled with medicines.  You have swelling around your tooth, in your face or neck.  You have bleeding which starts, continues, or gets worse.  You have a fever >101 If you are unable to open your mouth

## 2017-08-13 NOTE — ED Provider Notes (Signed)
[facility]   CSN: [id number] Arrival date & time: 08/13/17  1753     History   Chief Complaint Chief Complaint  Patient presents with  . Dental Pain    HPI Shelia Richards is a 21 y.o. female.  The history is provided by the patient and medical records. No language interpreter was used.  Dental Pain      Shelia Richards is a 21 y.o. female who presents to the Emergency Department complaining of persistent, gradually worsening, right-sided, upper dental pain beginning about two weeks ago. Pt describes their pain as throbbing. Initially, she was seen in ED for headache but as the week progressed, she realized that her tooth was bothering her and pain radiating to her head. Pt has been taking Tylenol at home with minimal relief of pain. Pain is exacerbated by eating or touching the tooth. They are not followed by dentistry.  Pt denies facial swelling, fever, chills, difficulty breathing, difficulty swallowing.   Past Medical History:  Diagnosis Date  . Bronchitis     There are no active problems to display for this patient.   Past Surgical History:  Procedure Laterality Date  . FINGER SURGERY Left    ring finger, couple years ago    OB History    No data available       Home Medications    Prior to Admission medications   Medication Sig Start Date End Date Taking? Authorizing Provider  albuterol (PROVENTIL HFA;VENTOLIN HFA) 108 (90 Base) MCG/ACT inhaler Inhale 1-2 puffs into the lungs every 6 (six) hours as needed for wheezing or shortness of breath. 08/08/17   Raeford Razor, MD  ondansetron (ZOFRAN ODT) 4 MG disintegrating tablet Take 1 tablet (4 mg total) by mouth every 8 (eight) hours as needed for nausea or vomiting. 06/24/17   Pricilla Loveless, MD  penicillin v potassium (VEETID) 500 MG tablet Take 1 tablet (500 mg total) by mouth 4 (four) times daily. 08/13/17 08/20/17  Quida Glasser, Chase Picket, PA-C    Family History History reviewed. No pertinent  family history.  Social History Social History  Substance Use Topics  . Smoking status: Never Smoker  . Smokeless tobacco: Never Used  . Alcohol use Yes     Comment: occ wine     Allergies   Patient has no known allergies.   Review of Systems Review of Systems  Constitutional: Negative for fever.  HENT: Positive for dental problem. Negative for trouble swallowing.   Respiratory: Negative for shortness of breath.      Physical Exam Updated Vital Signs BP 111/64 (BP Location: Right Arm)   Pulse 90   Temp 98.7 F (37.1 C) (Oral)   Resp 18   Ht 5\' 2"  (1.575 m)   Wt 79.4 kg (175 lb)   LMP 07/28/2017 (Approximate)   SpO2 100%   BMI 32.01 kg/m   Physical Exam  Constitutional: She appears well-developed and well-nourished. No distress.  HENT:  Head: Normocephalic and atraumatic.  Mouth/Throat:    Dental cavities and poor oral dentition noted. Pain along tooth as depicted in image. No abscess noted. Midline uvula. No trismus. OP moist and clear. No oropharyngeal erythema or edema. Neck supple with no tenderness. No facial edema.  Neck: Neck supple.  Cardiovascular: Normal rate, regular rhythm and normal heart sounds.   No murmur heard. Pulmonary/Chest: Effort normal and breath sounds normal. No respiratory distress. She has no wheezes. She has no rales.  Musculoskeletal: Normal range of motion.  Neurological: She is alert.  Skin: Skin is warm and dry.  Nursing note and vitals reviewed.    ED Treatments / Results  Labs (all labs ordered are listed, but only abnormal results are displayed) Labs Reviewed - No data to display  EKG  EKG Interpretation None       Radiology No results found.  Procedures Procedures (including critical care time)  Medications Ordered in ED Medications - No data to display   Initial Impression / Assessment and Plan / ED Course  I have reviewed the triage vital signs and the nursing notes.  Pertinent labs & imaging  results that were available during my care of the patient were reviewed by me and considered in my medical decision making (see chart for details).    Phineas Semenalah Corralejo is a 21 y.o. female who presents to ED for dental pain. No abscess requiring immediate incision and drainage. Patient is afebrile, non toxic appearing, and swallowing secretions well. Exam not concerning for Ludwig's angina or pharyngeal abscess. Will treat with PenVK. I provided dental resource guide and stressed the importance of dental follow up for ultimate management of dental pain. Patient voices understanding and is agreeable to plan.   Final Clinical Impressions(s) / ED Diagnoses   Final diagnoses:  Pain, dental    New Prescriptions New Prescriptions   PENICILLIN V POTASSIUM (VEETID) 500 MG TABLET    Take 1 tablet (500 mg total) by mouth 4 (four) times daily.     Bazil Dhanani, Chase PicketJaime Pilcher, PA-C 08/13/17 1937    Clarene DukeLittle, Ambrose Finlandachel Morgan, MD 08/14/17 2221

## 2017-08-13 NOTE — ED Triage Notes (Signed)
Patient states that she was here recently for a HA, States that she feels like her HA was being caused by this pain in her teeth. Patient is holding her right side of her face and states that the pain is intolerable. Patient reports that " I am taking tylenol, probably too much"  - patient given an Ice pack for comfort

## 2017-08-14 ENCOUNTER — Emergency Department (HOSPITAL_BASED_OUTPATIENT_CLINIC_OR_DEPARTMENT_OTHER)
Admission: EM | Admit: 2017-08-14 | Discharge: 2017-08-14 | Disposition: A | Discharge status: Home / Self Care | Payer: Medicaid Other | Attending: Emergency Medicine | Admitting: Emergency Medicine

## 2017-08-14 ENCOUNTER — Encounter (HOSPITAL_BASED_OUTPATIENT_CLINIC_OR_DEPARTMENT_OTHER): Payer: Self-pay | Admitting: *Deleted

## 2017-08-14 DIAGNOSIS — Z451 Encounter for adjustment and management of infusion pump: Secondary | ICD-10-CM | POA: Insufficient documentation

## 2017-08-14 DIAGNOSIS — Z5321 Procedure and treatment not carried out due to patient leaving prior to being seen by health care provider: Secondary | ICD-10-CM | POA: Insufficient documentation

## 2017-08-14 NOTE — ED Notes (Signed)
Called to treatment room with no answer from lobby 

## 2017-08-14 NOTE — ED Triage Notes (Signed)
Pt seen here earlier this pm for same, [facility]

## 2017-08-29 NOTE — ED Provider Notes (Signed)
[facility]-EMERGENCY DEPT Provider Note   CSN: [id number] Arrival date & time: 08/08/17  0808     History   Chief Complaint Chief Complaint  Patient presents with  . Headache    HPI Shelia Richards is a 21 y.o. female.  HPI    21 year old female with headache. Intermittent for the past month but more frequent and intense the last several days. Right sided. Waxes and wanes without appreciable exacerbating relieving factors. No fevers or chills. No trauma. Is been taking Tylenol with some mild relief. Past Medical History:  Diagnosis Date  . Bronchitis     There are no active problems to display for this patient.   Past Surgical History:  Procedure Laterality Date  . FINGER SURGERY Left    ring finger, couple years ago    OB History    No data available       Home Medications    Prior to Admission medications   Medication Sig Start Date End Date Taking? Authorizing Provider  albuterol (PROVENTIL HFA;VENTOLIN HFA) 108 (90 Base) MCG/ACT inhaler Inhale 1-2 puffs into the lungs every 6 (six) hours as needed for wheezing or shortness of breath. 08/08/17   Raeford Razor, MD  ondansetron (ZOFRAN ODT) 4 MG disintegrating tablet Take 1 tablet (4 mg total) by mouth every 8 (eight) hours as needed for nausea or vomiting. 06/24/17   Pricilla Loveless, MD    Family History No family history on file.  Social History Social History  Substance Use Topics  . Smoking status: Never Smoker  . Smokeless tobacco: Never Used  . Alcohol use Yes     Comment: occ wine     Allergies   Patient has no known allergies.   Review of Systems Review of Systems  All systems reviewed and negative, other than as noted in HPI.  Physical Exam Updated Vital Signs BP (!) 112/59 (BP Location: Left Arm)   Pulse 66   Temp 98.7 F (37.1 C) (Oral)   Resp 18   Ht  (1.575 m)   Wt 79.4 kg (175 lb)   LMP 07/28/2017 (Approximate)   SpO2 100%   BMI 32.01 kg/m   Physical Exam  Constitutional:  She is oriented to person, place, and time. She appears well-developed and well-nourished. No distress.  HENT:  Head: Normocephalic and atraumatic.  Eyes: Conjunctivae are normal. Right eye exhibits no discharge. Left eye exhibits no discharge.  Neck: Neck supple.  Cardiovascular: Normal rate, regular rhythm and normal heart sounds.  Exam reveals no gallop and no friction rub.   No murmur heard. Pulmonary/Chest: Effort normal and breath sounds normal. No respiratory distress.  Abdominal: Soft. She exhibits no distension. There is no tenderness.  Musculoskeletal: She exhibits no edema or tenderness.  Neurological: She is alert and oriented to person, place, and time. She displays normal reflexes. No cranial nerve deficit or sensory deficit. She exhibits normal muscle tone.  Skin: Skin is warm and dry.  Psychiatric: She has a normal mood and affect. Her behavior is normal. Thought content normal.  Nursing note and vitals reviewed.    ED Treatments / Results  Labs (all labs ordered are listed, but only abnormal results are displayed) Labs Reviewed - No data to display  EKG  EKG Interpretation None       Radiology No results found.  Procedures Procedures (including critical care time)  Medications Ordered in ED Medications  metoCLOPramide (REGLAN) tablet 10 mg (10 mg Oral Given 08/08/17 0843)  ibuprofen (ADVIL,MOTRIN)  tablet 600 mg (600 mg Oral Given 08/08/17 4098)     Initial Impression / Assessment and Plan / ED Course  I have reviewed the triage vital signs and the nursing notes.  Pertinent labs & imaging results that were available during my care of the patient were reviewed by me and considered in my medical decision making (see chart for details).      Final Clinical Impressions(s) / ED Diagnoses   Final diagnoses:  Nonintractable headache, unspecified chronicity pattern, unspecified headache type    New Prescriptions Discharge Medication List as of 08/08/2017   8:39 AM    START taking these medications   Details  albuterol (PROVENTIL HFA;VENTOLIN HFA) 108 (90 Base) MCG/ACT inhaler Inhale 1-2 puffs into the lungs every 6 (six) hours as needed for wheezing or shortness of breath., Starting Fri 08/08/2017, Print         Raeford Razor, MD 08/29/17 1658

## 2017-11-15 ENCOUNTER — Other Ambulatory Visit: Payer: Self-pay

## 2017-11-15 ENCOUNTER — Emergency Department (HOSPITAL_BASED_OUTPATIENT_CLINIC_OR_DEPARTMENT_OTHER): Payer: Self-pay

## 2017-11-15 ENCOUNTER — Encounter (HOSPITAL_BASED_OUTPATIENT_CLINIC_OR_DEPARTMENT_OTHER): Payer: Self-pay | Admitting: Emergency Medicine

## 2017-11-15 ENCOUNTER — Emergency Department (HOSPITAL_BASED_OUTPATIENT_CLINIC_OR_DEPARTMENT_OTHER)
Admission: EM | Admit: 2017-11-15 | Discharge: 2017-11-15 | Disposition: A | Discharge status: Home / Self Care | Payer: Self-pay | Attending: Physician Assistant | Admitting: Physician Assistant

## 2017-11-15 DIAGNOSIS — G8929 Other chronic pain: Secondary | ICD-10-CM | POA: Insufficient documentation

## 2017-11-15 DIAGNOSIS — M25562 Pain in left knee: Secondary | ICD-10-CM | POA: Insufficient documentation
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
## 2017-11-15 NOTE — Discharge Instructions (Signed)
The x-ray of your knee shows no fracture.  Please do you to take ibuprofen and Tylenol as needed for pain. You can use the knee sleeve for support.  Follow up with your primary doctor if your symptoms are not improving.   Return to the ER if you have any new or worsening symptoms.
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
## 2018-06-16 ENCOUNTER — Emergency Department (HOSPITAL_BASED_OUTPATIENT_CLINIC_OR_DEPARTMENT_OTHER)
Admission: EM | Admit: 2018-06-16 | Discharge: 2018-06-17 | Disposition: A | Discharge status: Home / Self Care | Payer: Self-pay | Attending: Emergency Medicine | Admitting: Emergency Medicine

## 2018-06-16 ENCOUNTER — Encounter (HOSPITAL_BASED_OUTPATIENT_CLINIC_OR_DEPARTMENT_OTHER): Payer: Self-pay | Admitting: Emergency Medicine

## 2018-06-16 ENCOUNTER — Other Ambulatory Visit: Payer: Self-pay

## 2018-06-16 DIAGNOSIS — R45851 Suicidal ideations: Secondary | ICD-10-CM | POA: Insufficient documentation

## 2018-06-16 DIAGNOSIS — R451 Restlessness and agitation: Secondary | ICD-10-CM | POA: Insufficient documentation

## 2018-06-16 LAB — RAPID URINE DRUG SCREEN, HOSP PERFORMED
Amphetamines: NOT DETECTED
BENZODIAZEPINES: NOT DETECTED
COCAINE: NOT DETECTED
OPIATES: NOT DETECTED
Tetrahydrocannabinol: NOT DETECTED

## 2018-06-16 LAB — COMPREHENSIVE METABOLIC PANEL
ALBUMIN: 3.8 g/dL (ref 3.5–5.0)
ALK PHOS: 50 U/L (ref 38–126)
ALT: 11 U/L (ref 0–44)
ANION GAP: 8 (ref 5–15)
AST: 18 U/L (ref 15–41)
BILIRUBIN TOTAL: 0.4 mg/dL (ref 0.3–1.2)
BUN: 9 mg/dL (ref 6–20)
CALCIUM: 9.4 mg/dL (ref 8.9–10.3)
CO2: 27 mmol/L (ref 22–32)
Chloride: 102 mmol/L (ref 98–111)
Creatinine, Ser: 0.75 mg/dL (ref 0.44–1.00)
GFR calc non Af Amer: >60 mL/min (ref >60)
Glucose, Bld: 106 mg/dL — ABNORMAL HIGH (ref 70–99)
POTASSIUM: 3.8 mmol/L (ref 3.5–5.1)
Sodium: 137 mmol/L (ref 135–145)
TOTAL PROTEIN: 7.6 g/dL (ref 6.5–8.1)

## 2018-06-16 LAB — CBC
HCT: 40.4 % (ref 36.0–46.0)
Hemoglobin: 13.8 g/dL (ref 12.0–15.0)
MCH: 30.1 pg (ref 26.0–34.0)
MCHC: 34.2 g/dL (ref 30.0–36.0)
MCV: 88 fL (ref 78.0–100.0)
PLATELETS: 317 10*3/uL (ref 150–400)
RBC: 4.59 MIL/uL (ref 3.87–5.11)
RDW: 13.1 % (ref 11.5–15.5)
WBC: 9.1 10*3/uL (ref 4.0–10.5)

## 2018-06-16 LAB — ETHANOL

## 2018-06-16 LAB — ACETAMINOPHEN LEVEL

## 2018-06-16 LAB — SALICYLATE LEVEL

## 2018-06-16 LAB — PREGNANCY, URINE: Preg Test, Ur: NEGATIVE
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
## 2018-06-16 NOTE — ED Triage Notes (Signed)
Pt states "I don't want to be here any more." pt admits thoughts of self harm, does not have a plan.

## 2018-06-16 NOTE — ED Notes (Signed)
Pt belongings: Shoes, pants, underwear, shirt, bra, and headband. Pt's friend has necklace in a cup with pt's sticker.

## 2018-06-17 NOTE — ED Notes (Signed)
Pt contracts for no self harm and given out patient resources for counseling.

## 2018-06-17 NOTE — ED Provider Notes (Signed)
[facility] EMERGENCY DEPARTMENT Provider Note  CSN: [id number] Arrival date & time: 06/16/18 2213  Chief Complaint(s) Suicidal  HPI Shelia Richards is a 22 y.o. female with no pertinent past medical history presents to the emergency department for suicidal thoughts.  She reports that she was in an argument with her father earlier this evening which made her very upset.  She reports that she started having thoughts of hurting herself, stating that she took a cord from her computer and wrapped around her neck.  She tied the cord off to the door and sat down on the floor not applying pressure.  She states that she sat there for a while and decided not to go through with any self-harm.  Patient endorses prior history of fleeting suicidal thoughts in the past but has never attempted to commit suicide.  Patient reports that her agitation and frustration has now subsided after talking with her close friend.  She is denied any active suicidal ideation at this time, stating adamantly that she does not want to hurt herself.  She denies any homicidal ideations, or AVH.   HPI  Past Medical History Past Medical History:  Diagnosis Date  . Bronchitis    There are no active problems to display for this patient.  Home Medication(s) Prior to Admission medications   Medication Sig Start Date End Date Taking? Authorizing Provider  albuterol (PROVENTIL HFA;VENTOLIN HFA) 108 (90 Base) MCG/ACT inhaler Inhale 1-2 puffs into the lungs every 6 (six) hours as needed for wheezing or shortness of breath. 08/08/17   Raeford Razor, MD  ondansetron (ZOFRAN ODT) 4 MG disintegrating tablet Take 1 tablet (4 mg total) by mouth every 8 (eight) hours as needed for nausea or vomiting. 06/24/17   Pricilla Loveless, MD                                                                                                                                    Past Surgical History Past Surgical History:  Procedure Laterality Date  .  FINGER SURGERY Left    ring finger, couple years ago   Family History No family history on file.  Social History Social History   Tobacco Use  . Smoking status: Never Smoker  . Smokeless tobacco: Never Used  Substance Use Topics  . Alcohol use: Yes    Comment: occ wine  . Drug use: No   Allergies Patient has no known allergies.  Review of Systems Review of Systems All other systems are reviewed and are negative for acute change except as noted in the HPI  Physical Exam Vital Signs  I have reviewed the triage vital signs BP (!) 120/56 (BP Location: Left Arm)   Pulse 63   Temp 98.2 F (36.8 C) (Oral)   Resp 18   Ht 5\' 3"  (1.6 m)   Wt 70.8 kg (156 lb)   SpO2 100%   BMI 27.63 kg/m  Physical Exam  Constitutional: She is oriented to person, place, and time. She appears well-developed and well-nourished. No distress.  HENT:  Head: Normocephalic and atraumatic.  Right Ear: External ear normal.  Left Ear: External ear normal.  Nose: Nose normal.  Eyes: Conjunctivae and EOM are normal. No scleral icterus.  Neck: Normal range of motion and phonation normal.  Cardiovascular: Normal rate and regular rhythm.  Pulmonary/Chest: Effort normal. No stridor. No respiratory distress.  Abdominal: She exhibits no distension.  Musculoskeletal: Normal range of motion. She exhibits no edema.  Neurological: She is alert and oriented to person, place, and time.  Skin: She is not diaphoretic.  Psychiatric: She has a normal mood and affect. Her speech is normal and behavior is normal. Judgment and thought content normal. She is not actively hallucinating. Cognition and memory are normal. She is attentive.  Vitals reviewed.   ED Results and Treatments Labs (all labs ordered are listed, but only abnormal results are displayed) Labs Reviewed  COMPREHENSIVE METABOLIC PANEL - Abnormal; Notable for the following components:      Result Value   Glucose, Bld 106 (*)    All other components  within normal limits  ACETAMINOPHEN LEVEL - Abnormal; Notable for the following components:   Acetaminophen (Tylenol), Serum <10 (*)    All other components within normal limits  RAPID URINE DRUG SCREEN, HOSP PERFORMED - Abnormal; Notable for the following components:   Barbiturates   (*)    Value: Result not available. Reagent lot number recalled by manufacturer.   All other components within normal limits  ETHANOL  SALICYLATE LEVEL  CBC  PREGNANCY, URINE                                                                                                                         EKG  EKG Interpretation  Date/Time:    Ventricular Rate:    PR Interval:    QRS Duration:   QT Interval:    QTC Calculation:   R Axis:     Text Interpretation:        Radiology No results found. Pertinent labs & imaging results that were available during my care of the patient were reviewed by me and considered in my medical decision making (see chart for details).  Medications Ordered in ED Medications - No data to display  Procedures Procedures  (including critical care time)  Medical Decision Making / ED Course I have reviewed the nursing notes for this encounter and the patient's prior records (if available in EHR or on provided paperwork).    Patient with suicidal thoughts adamantly denying any suicidal ideations at this time.  Patient is expressing forward thinking and is able to contract for safety.  She is calm and appears to be reasonable.  Her friend at bedside also feels that her agitation and stress has subsided and stating that she will be with the patient to ensure she does not herself.  We discussed options regarding behavioral health evaluation here in the emergency department versus provided resources.  She reports that she would appreciate outpatient resources.   The patient does not appear to be an immediate threat to herself or others at this time.  She was informed that at any time if she felt that the thoughts returned that she could come to the emergency department for assistance.  The patient appears reasonably screened and/or stabilized for discharge and I doubt any other medical condition or other [facility] requiring further screening, evaluation, or treatment in the ED at this time prior to discharge.  The patient is safe for discharge with strict return precautions.    Final Clinical Impression(s) / ED Diagnoses Final diagnoses:  Suicidal ideation    Disposition: Discharge  Condition: Good  I have discussed the results, Dx and Tx plan with the patient who expressed understanding and agree(s) with the plan. Discharge instructions discussed at great length. The patient was given strict return precautions who verbalized understanding of the instructions. No further questions at time of discharge.    ED Discharge Orders    None       Follow Up: Elizabeth Palau, FNP [address] [place] [address] [phone]  Schedule an appointment as soon as possible for a visit  As needed     This chart was dictated using voice recognition software.  Despite best efforts to proofread,  errors can occur which can change the documentation meaning.   Nira Conn, MD 06/17/18 [phone]

## 2018-06-26 MED FILL — metroNIDAZOLE 500 MG TABS: 500 | 7 days supply | Qty: 14 | Fill #0

## 2018-06-27 IMAGING — CT CT HEAD W/O CM
3 series · 16 of 47 positions shown, 19 images · non-contrast
Comparison: None.

CLINICAL DATA: Frontal headache and dizziness.

EXAM:
CT HEAD WITHOUT CONTRAST
TECHNIQUE: Contiguous axial images were obtained from the base of the skull
through the vertex without intravenous contrast.

[Series 2: head wo · axial · 0.39mm/px · z∈[-188,-58]mm · 10 of 32 slices shown, 13 images]
[im 3/32  brain]
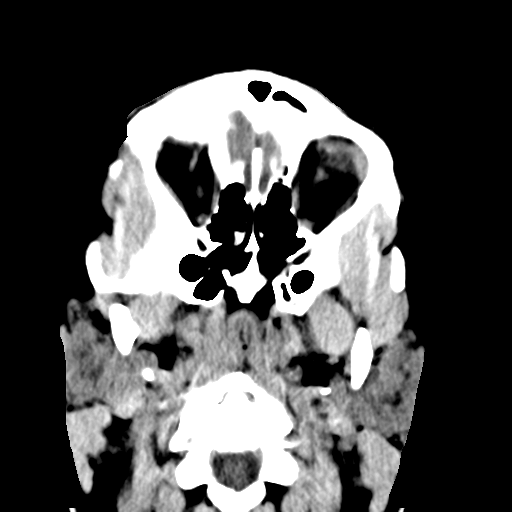
[im 3/32  bone]
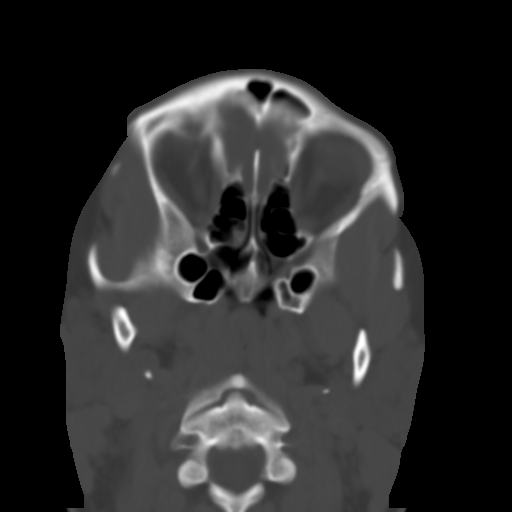
[im 6/32  brain]
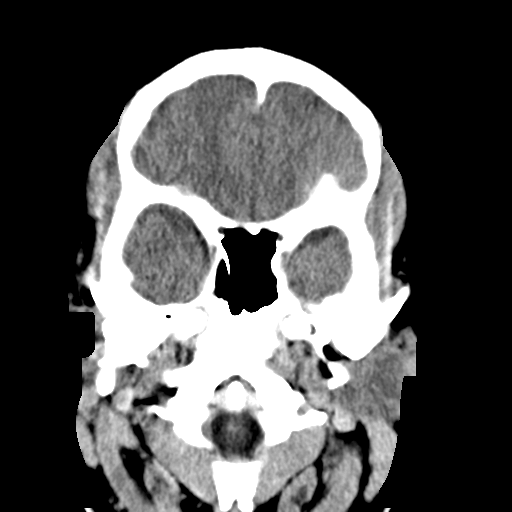
[im 9/32  brain]
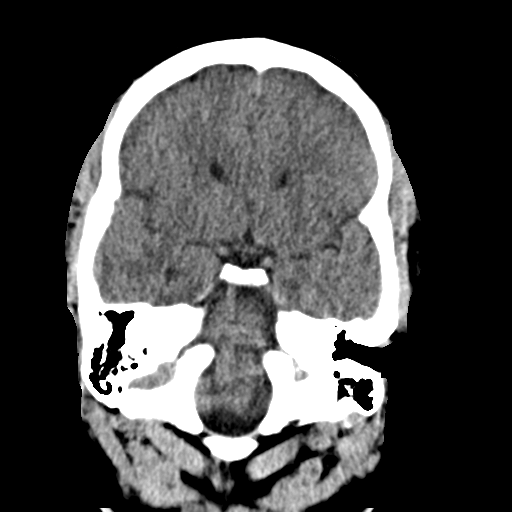
[im 11/32  brain]
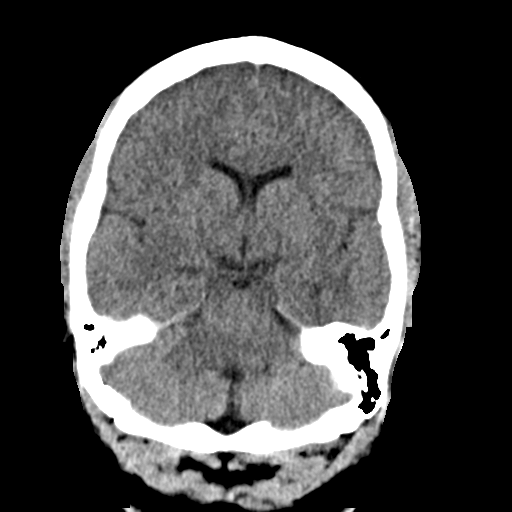
[im 14/32  brain]
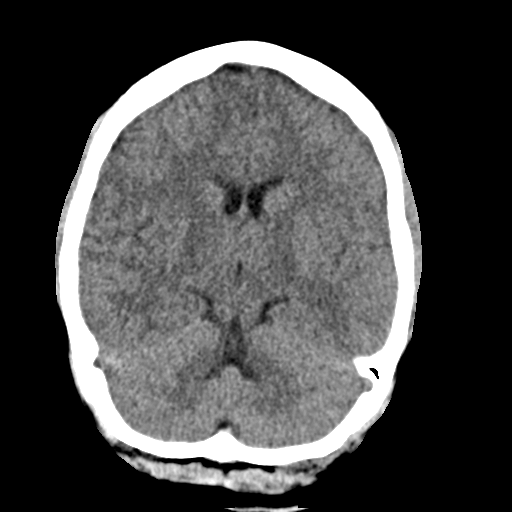
[im 14/32  bone]
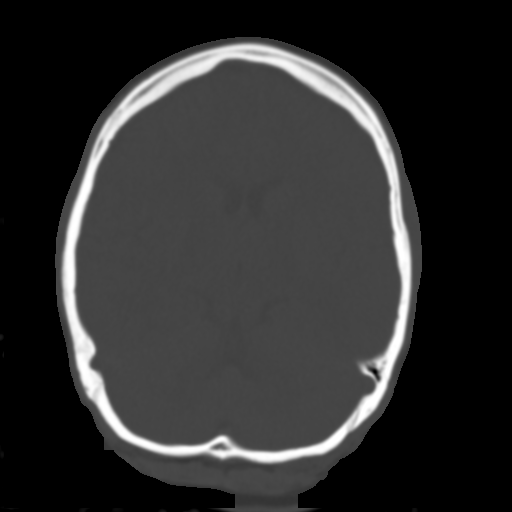
[im 18/32  brain]
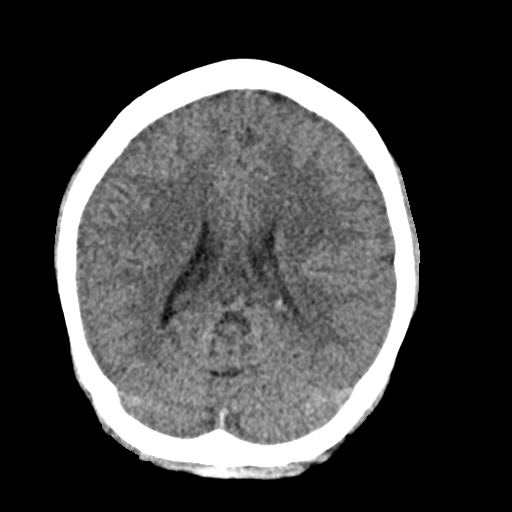
[im 21/32  brain]
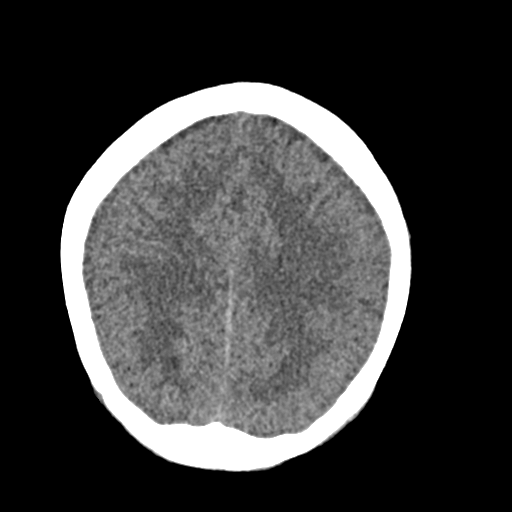
[im 24/32  brain]
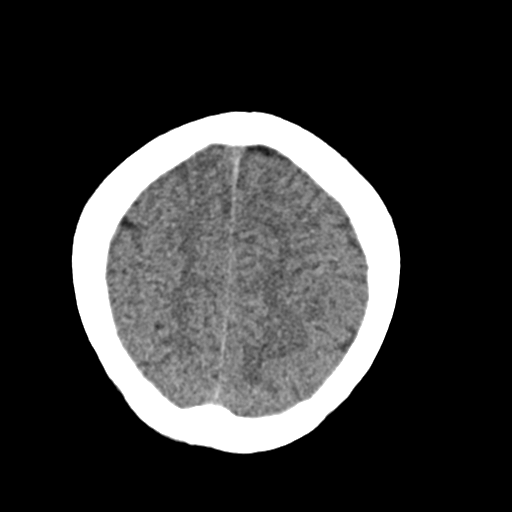
[im 26/32  brain]
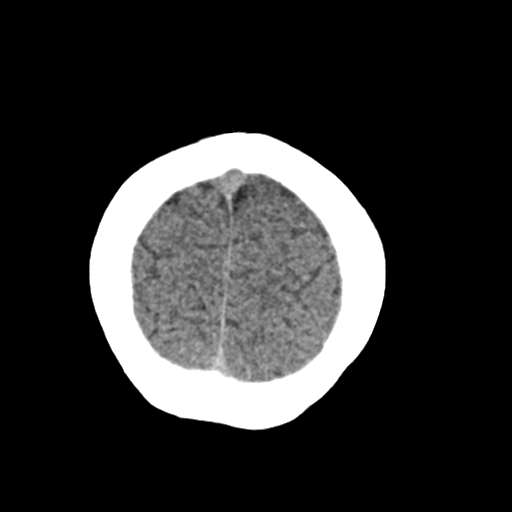
[im 26/32  bone]
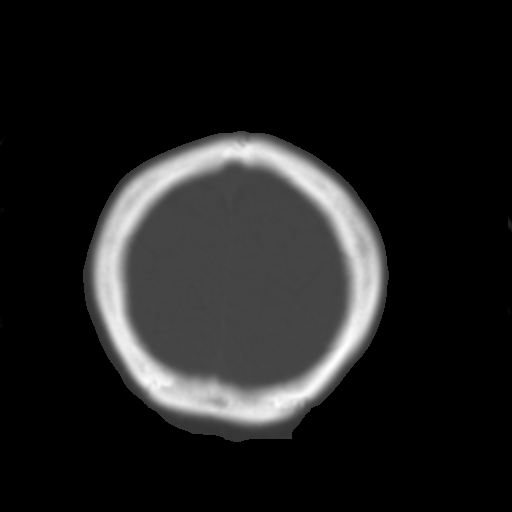
[im 29/32  brain]
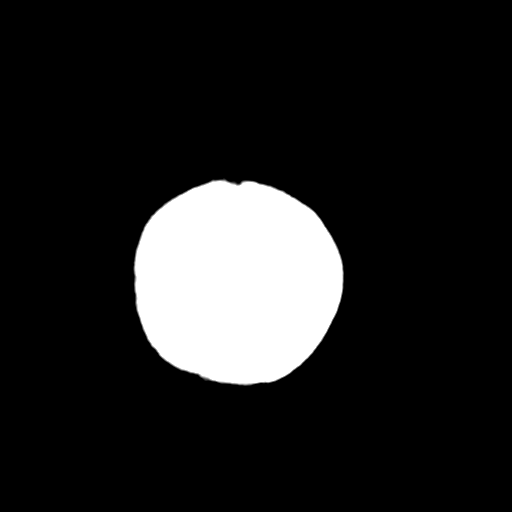

[Series 4: coronal soft · coronal · 0.29mm/px · 3 of 63 slices shown]
[im 21/63  brain]
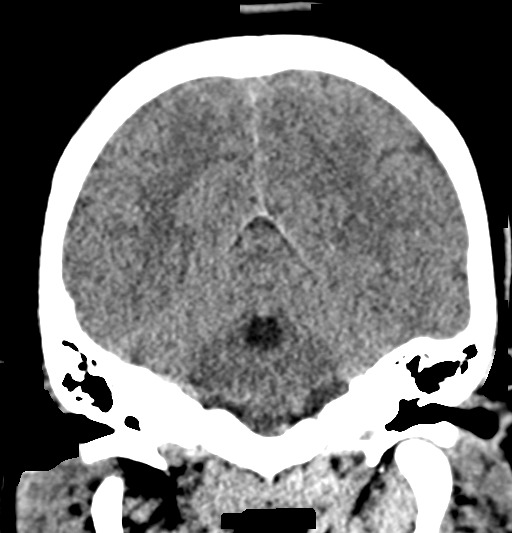
[im 28/63  brain]
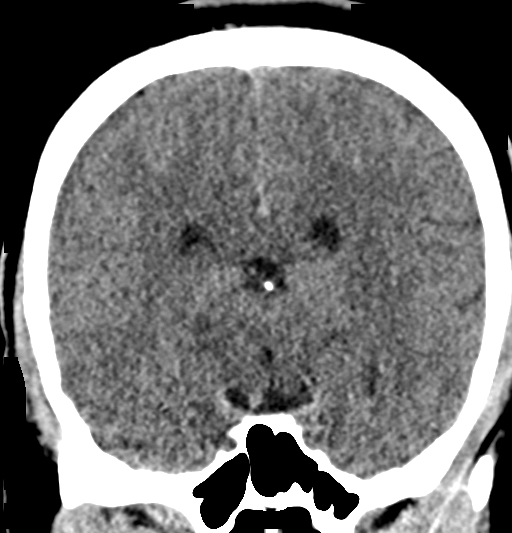
[im 35/63  brain]
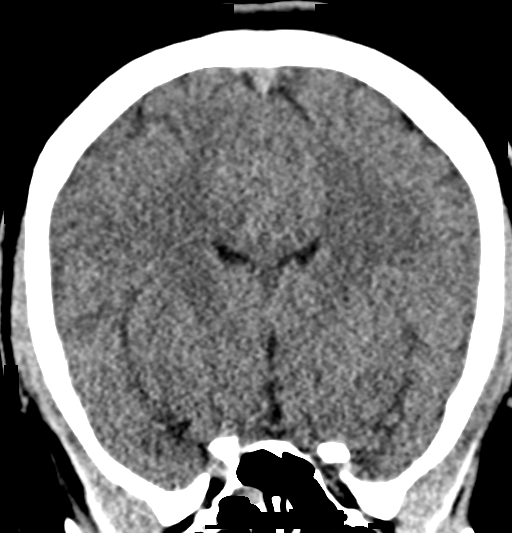

[Series 5: sag soft · sagittal · 0.32mm/px · 3 of 57 slices shown]
[im 19/57  brain]
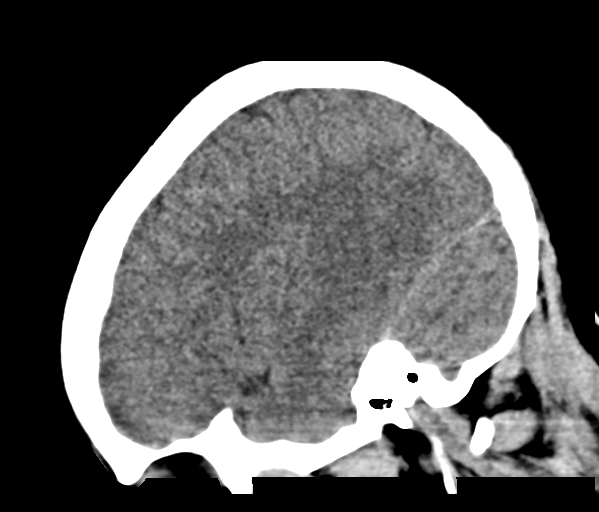
[im 29/57  brain]
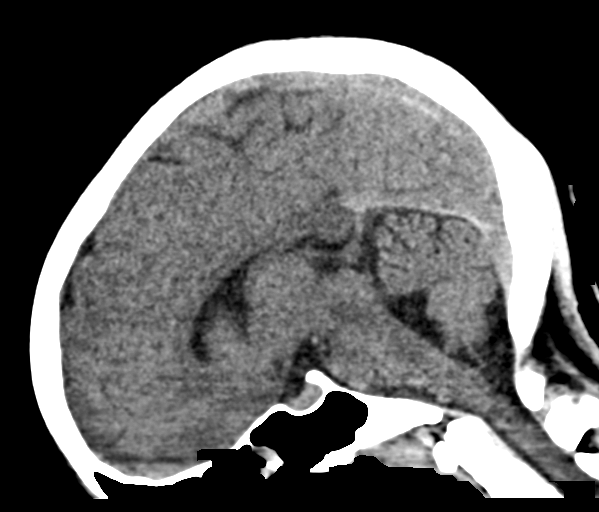
[im 38/57  brain]
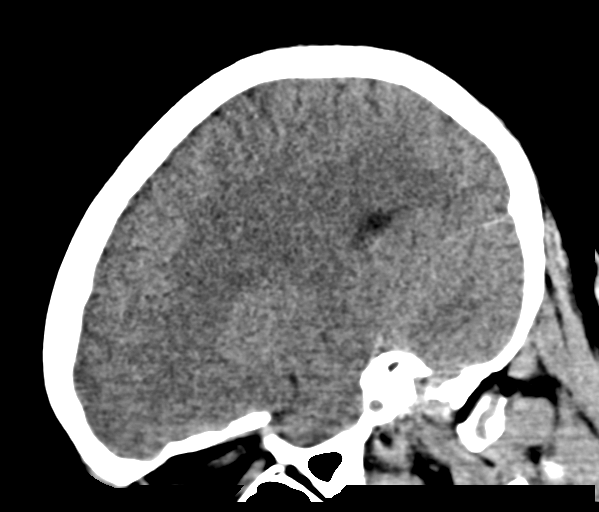

[16 of 47 positions shown; findings below may reference images not displayed]

FINDINGS: Brain: No evidence of acute infarction, hemorrhage, hydrocephalus,
extra-axial collection or mass lesion/mass effect.

Vascular: No hyperdense vessel or unexpected calcification.

Skull: Normal. Negative for fracture or focal lesion.

Sinuses/Orbits: No acute finding.

Other: None.
IMPRESSION: Normal head CT.

## 2018-09-01 ENCOUNTER — Emergency Department (HOSPITAL_BASED_OUTPATIENT_CLINIC_OR_DEPARTMENT_OTHER)
Admission: EM | Admit: 2018-09-01 | Discharge: 2018-09-01 | Disposition: A | Discharge status: Home / Self Care | Payer: Self-pay | Attending: Emergency Medicine | Admitting: Emergency Medicine

## 2018-09-01 ENCOUNTER — Encounter (HOSPITAL_BASED_OUTPATIENT_CLINIC_OR_DEPARTMENT_OTHER): Payer: Self-pay | Admitting: *Deleted

## 2018-09-01 ENCOUNTER — Other Ambulatory Visit: Payer: Self-pay

## 2018-09-01 DIAGNOSIS — K0889 Other specified disorders of teeth and supporting structures: Secondary | ICD-10-CM | POA: Insufficient documentation

## 2018-09-01 DIAGNOSIS — R519 Headache, unspecified: Secondary | ICD-10-CM

## 2018-09-01 DIAGNOSIS — G8929 Other chronic pain: Secondary | ICD-10-CM | POA: Insufficient documentation

## 2018-09-01 DIAGNOSIS — K089 Disorder of teeth and supporting structures, unspecified: Secondary | ICD-10-CM

## 2018-09-01 DIAGNOSIS — Z79899 Other long term (current) drug therapy: Secondary | ICD-10-CM | POA: Insufficient documentation

## 2018-09-01 DIAGNOSIS — R51 Headache: Secondary | ICD-10-CM | POA: Insufficient documentation

## 2018-09-01 LAB — PREGNANCY, URINE: Preg Test, Ur: NEGATIVE

## 2018-09-01 MED ORDER — PENICILLIN V POTASSIUM 500 MG PO TABS: 500.0000 mg | ORAL_TABLET | Freq: Four times a day (QID) | ORAL | 0 refills | Status: AC

## 2018-09-01 MED ORDER — CHLORHEXIDINE GLUCONATE 0.12 % MT SOLN: 15.0000 mL | Freq: Two times a day (BID) | OROMUCOSAL | 0 refills | Status: DC

## 2018-09-01 MED ORDER — BUPIVACAINE-EPINEPHRINE (PF) 0.5% -1:200000 IJ SOLN
1.8000 mL | Freq: Once | INTRAMUSCULAR | Status: AC
  Administered 2018-09-01: 1.8 mL
  Filled 2018-09-01: qty 1.8

## 2018-09-01 MED ORDER — LIDOCAINE VISCOUS HCL 2 % MT SOLN: 15.0000 mL | OROMUCOSAL | 0 refills | Status: DC | PRN

## 2018-09-01 MED ORDER — NAPROXEN 375 MG PO TABS: 375.0000 mg | ORAL_TABLET | Freq: Two times a day (BID) | ORAL | 0 refills | Status: DC

## 2018-09-01 MED FILL — NAPROXEN 375 MG TABLET: 375 | 10 days supply | Qty: 20 | Fill #0

## 2018-09-01 MED FILL — CHLORHEXIDINE 0.12% RINSE: 0.12 | 16 days supply | Qty: 473 | Fill #0

## 2018-09-01 MED FILL — PENICILLIN VK 500 MG TABLET: 500 | 5 days supply | Qty: 20 | Fill #0

## 2018-09-01 MED FILL — LIDOCAINE 2% VISCOUS SOLN: 2 | 7 days supply | Qty: 100 | Fill #0

## 2018-09-01 NOTE — Discharge Instructions (Signed)
Please see the information and instructions below regarding your visit.  Your diagnoses today include:  1. Chronic dental pain   2. Right-sided headache    You have a dental injury/infection. It is very important that you get evaluated by a dentist as soon as possible. Call tomorrow to schedule an appointment. Naproxen and Tylenol as needed for pain. Take your full course of antibiotics. Read the instructions below.  Tests performed today include: See side panel of your discharge paperwork for testing performed today. Vital signs are listed at the bottom of these instructions.   Medications prescribed:    Take any prescribed medications only as prescribed, and any over the counter medications only as directed on the packaging.  1. You are prescribed Penicillin, an antibiotic. Please take all of your antibiotics until finished.   You may develop abdominal discomfort or nausea from the antibiotic. If this occurs, you may take it with food. Some patients also get diarrhea with antibiotics. You may help offset this with probiotics which you can buy or get in yogurt. Do not eat or take the probiotics until 2 hours after your antibiotic. Some women develop vaginal yeast infections after antibiotics. If you develop unusual vaginal discharge after being on this medication, please see your primary care provider.   Some people develop allergies to antibiotics. Symptoms of antibiotic allergy can be mild and include a flat rash and itching. They can also be more serious and include:  ?Hives - Hives are raised, red patches of skin that are usually very itchy.  ?Lip or tongue swelling  ?Trouble swallowing or breathing  ?Blistering of the skin or mouth.  If you have any of these serious symptoms, please seek emergency medical care immediately.  2. You are prescribed naproxen, a non-steroidal anti-inflammatory agent (NSAID) for pain. You may take 375 mg every 12 hours as needed for pain. If still  requiring this medication around the clock for acute pain after 10 days, please see your primary healthcare provider.  You may combine this medication with Tylenol, [phone] mg every 6 hours, so you are receiving something for pain every 3 hours. DO NOT EXCEED 4000 MG IN ONE DAY (THIS MEANS NO MORE THAN 8 PILLS IN  A DAY). TAKING MORE THAN THIS AMOUNT CAN HARM THE LIVER.   This is not a long-term medication unless under the care and direction of your primary provider. Taking this medication long-term and not under the supervision of a healthcare provider could increase the risk of stomach ulcers, kidney problems, and cardiovascular problems such as high blood pressure.   3. You are prescribed chlorhexidine solution swish and spit twice daily. Do not swallow. This is to clear bacteria from your mouth.   4. You are prescribed viscous lidocaine swish and spit every 6 hours as needed for tooth and gum discomfort. Do not swallow.    Home care instructions:  Please follow any educational materials contained in this packet.   Eat a soft or liquid diet and rinse your mouth out after meals with warm water. You should see a dentist or return here at once if you have increased swelling, increased pain or uncontrolled bleeding from the site of your injury.  Follow-up instructions: It is very important that you see a dentist as soon as possible. There is a list of dentists attached to this packet if you do not have care established with a dentist already. Please give a call to a dentist of your choice tomorrow.  Return  instructions:  Please return to the Emergency Department if you experience worsening symptoms.  Please seek care if you note any of the following about your dental pain:  You have increased pain not controlled with medicines.  You have swelling around your tooth, in your face or neck.  You have bleeding which starts, continues, or gets worse.  You have a fever >101 If you are unable to  open your mouth Please return if you have any other emergent concerns.  Additional Information:   Your vital signs today were: BP 126/86 (BP Location: Left Arm)    Pulse 78    Temp 98.9 F (37.2 C) (Oral)    Resp 20    Ht 5\' 4"  (1.626 m)    Wt 77.1 kg    SpO2 100%    BMI 29.18 kg/m  If your blood pressure (BP) was elevated on multiple readings during this visit above 130 for the top number or above 80 for the bottom number, please have this repeated by your primary care provider within one month. --------------  Thank you for allowing [place] to participate in your care today.

## 2018-09-01 NOTE — ED Triage Notes (Signed)
Headache x 4 days. She is crying at triage. States her jaw hurts and her gums are swollen.

## 2018-09-01 NOTE — ED Provider Notes (Signed)
[facility] EMERGENCY DEPARTMENT Provider Note   CSN: [id number] Arrival date & time: 09/01/18  1056     History   Chief Complaint Chief Complaint  Patient presents with  . Headache  . Dental Pain    HPI Shelia Richards is a 22 y.o. female.  HPI  Shelia Richards is a 22 y.o. female who presents to the Emergency Department complaining of persistent, gradually worsening, right-sided, upper dental pain beginning approximately one year ago, recurring and 4 days ago. Pt describes their pain as throbbing.  Patient reports that the pain is referring to the right side of her face, her right eye, and causing a headache.  Patient denies any neck pain or stiffness, visual disturbance, weakness or numbness.  Pt has been taking Tylenol at home with minimal relief of pain. Pain is exacerbated by chewing, talking, and breathing.  Patient reports she has upcoming dental appointment, as she recently obtained dental insurance, but does not have the appointment yet.  Pt denies facial swelling, fever, chills, difficulty breathing, difficulty swallowing.   Past Medical History:  Diagnosis Date  . Bronchitis     There are no active problems to display for this patient.   Past Surgical History:  Procedure Laterality Date  . FINGER SURGERY Left    ring finger, couple years ago     OB History   None      Home Medications    Prior to Admission medications   Medication Sig Start Date End Date Taking? Authorizing Provider  albuterol (PROVENTIL HFA;VENTOLIN HFA) 108 (90 Base) MCG/ACT inhaler Inhale 1-2 puffs into the lungs every 6 (six) hours as needed for wheezing or shortness of breath. 08/08/17  Yes Raeford Razor, MD  ondansetron (ZOFRAN ODT) 4 MG disintegrating tablet Take 1 tablet (4 mg total) by mouth every 8 (eight) hours as needed for nausea or vomiting. 06/24/17   Pricilla Loveless, MD    Family History No family history on file.  Social History Social History   Tobacco Use  .  Smoking status: Never Smoker  . Smokeless tobacco: Never Used  Substance Use Topics  . Alcohol use: Yes    Comment: occ wine  . Drug use: No     Allergies   Patient has no known allergies.   Review of Systems Review of Systems  Constitutional: Negative for chills and fever.  HENT: Positive for congestion and dental problem. Negative for sore throat, trouble swallowing and voice change.   Eyes: Negative for visual disturbance.  Respiratory: Negative for stridor.   Gastrointestinal: Negative for nausea and vomiting.  Neurological: Positive for headaches. Negative for weakness and numbness.     Physical Exam Updated Vital Signs BP 126/86 (BP Location: Left Arm)   Pulse 78   Temp 98.9 F (37.2 C) (Oral)   Resp 20   Ht 5\' 4"  (1.626 m)   Wt 77.1 kg   SpO2 100%   BMI 29.18 kg/m   Physical Exam  Constitutional: She appears well-developed and well-nourished. No distress.  Sitting comfortably in bed.  HENT:  Head: Normocephalic and atraumatic.  Mouth/Throat:    Dental cavities and poor oral dentition noted. Pain along tooth as depicted in image. No abscess noted. Midline uvula. No trismus. OP clear and moist. No oropharyngeal erythema or edema. Neck supple with no tenderness. No facial edema.  Eyes: Conjunctivae are normal. Right eye exhibits no discharge. Left eye exhibits no discharge.  EOMs normal to gross examination.  Neck: Normal range of motion.  Cardiovascular: Normal rate and regular rhythm.  Intact, 2+ radial pulse.  Pulmonary/Chest:  Normal respiratory effort. Patient converses comfortably. No audible wheeze or stridor.  Abdominal: She exhibits no distension.  Musculoskeletal: Normal range of motion.  Neurological: She is alert. She has normal strength. Gait normal. GCS eye subscore is 4. GCS verbal subscore is 5. GCS motor subscore is 6.  Cranial nerves intact to gross observation. Patient moves extremities without difficulty.  Skin: Skin is warm and dry.  She is not diaphoretic.  Psychiatric: She has a normal mood and affect. Her behavior is normal. Judgment and thought content normal.  Nursing note and vitals reviewed.    ED Treatments / Results  Labs (all labs ordered are listed, but only abnormal results are displayed) Labs Reviewed  PREGNANCY, URINE    EKG None  Radiology No results found.  Procedures Dental Block Date/Time: 09/01/2018 12:12 PM Performed by: Elisha Ponder, PA-C Authorized by: Elisha Ponder, PA-C   Consent:    Consent obtained:  Verbal   Consent given by:  Patient   Risks discussed:  Unsuccessful block   Alternatives discussed:  No treatment Indications:    Indications: dental pain   Location:    Block type:  Supraperiosteal   Supraperiosteal location:  Upper teeth   Upper teeth location:  4/RU 2nd bicuspid Procedure details (see MAR for exact dosages):    Topical anesthetic:  Benzocaine gel   Syringe type:  Aspirating dental syringe   Needle gauge:  25 G   Anesthetic injected:  Bupivacaine 0.5% WITH epi   Injection procedure:  Anatomic landmarks identified, introduced needle, incremental injection, negative aspiration for blood and anatomic landmarks palpated Post-procedure details:    Outcome:  Anesthesia achieved   Patient tolerance of procedure:  Tolerated well, no immediate complications   (including critical care time)   Medications Ordered in ED Medications  bupivacaine-epinephrine (MARCAINE W/ EPI) 0.5% -1:200000 injection 1.8 mL (has no administration in time range)     Initial Impression / Assessment and Plan / ED Course  I have reviewed the triage vital signs and the nursing notes.  Pertinent labs & imaging results that were available during my care of the patient were reviewed by me and considered in my medical decision making (see chart for details).  Clinical Course as of Sep 01 1210  Tue Sep 01, 2018  1208 Patient passing PO challenge.    [AM]    Clinical Course  User Index [AM] Elisha Ponder, PA-C    Shelia Richards is a 22 y.o. female who presents to ED for dental pain. No abscess requiring immediate incision and drainage. Patient is afebrile, non toxic appearing, and swallowing secretions well. Exam not concerning for Ludwig's angina or pharyngeal abscess.  Patient's right-sided headache appears to be stemming from this dental pain, as it follows pattern of trigeminal nerve.  Do not suspect trigeminal neuralgia.  Patient has clear dental defect.  Will treat with Penicillin VK, viscous lidocaine, and Peridex.  Successful dental block performed.  I provided dental resource guide and stressed the importance of dental follow up for ultimate management of dental pain. Patient voices understanding and is agreeable to plan.  Final Clinical Impressions(s) / ED Diagnoses   Final diagnoses:  Chronic dental pain  Right-sided headache    ED Discharge Orders         Ordered    penicillin v potassium (VEETID) 500 MG tablet  4 times daily     09/01/18 1207  lidocaine (XYLOCAINE) 2 % solution  As needed     09/01/18 1207    chlorhexidine (PERIDEX) 0.12 % solution  2 times daily     09/01/18 1207    naproxen (NAPROSYN) 375 MG tablet  2 times daily     09/01/18 1208           Delia ChimesMurray, Jontay Maston B, PA-C 09/01/18 1213    Azalia Bilisampos, Kevin, MD 09/01/18 1341

## 2018-12-31 ENCOUNTER — Other Ambulatory Visit: Payer: Self-pay

## 2018-12-31 ENCOUNTER — Emergency Department (HOSPITAL_BASED_OUTPATIENT_CLINIC_OR_DEPARTMENT_OTHER)
Admission: EM | Admit: 2018-12-31 | Discharge: 2018-12-31 | Disposition: A | Discharge status: Home / Self Care | Payer: No Typology Code available for payment source | Attending: Emergency Medicine | Admitting: Emergency Medicine

## 2018-12-31 ENCOUNTER — Encounter (HOSPITAL_BASED_OUTPATIENT_CLINIC_OR_DEPARTMENT_OTHER): Payer: Self-pay | Admitting: Emergency Medicine

## 2018-12-31 DIAGNOSIS — Y92481 Parking lot as the place of occurrence of the external cause: Secondary | ICD-10-CM | POA: Insufficient documentation

## 2018-12-31 DIAGNOSIS — Z79899 Other long term (current) drug therapy: Secondary | ICD-10-CM | POA: Insufficient documentation

## 2018-12-31 DIAGNOSIS — R1084 Generalized abdominal pain: Secondary | ICD-10-CM | POA: Diagnosis present

## 2018-12-31 DIAGNOSIS — F17290 Nicotine dependence, other tobacco product, uncomplicated: Secondary | ICD-10-CM | POA: Insufficient documentation

## 2018-12-31 DIAGNOSIS — Y999 Unspecified external cause status: Secondary | ICD-10-CM | POA: Insufficient documentation

## 2018-12-31 DIAGNOSIS — Y9389 Activity, other specified: Secondary | ICD-10-CM | POA: Insufficient documentation

## 2018-12-31 DIAGNOSIS — J45909 Unspecified asthma, uncomplicated: Secondary | ICD-10-CM | POA: Diagnosis not present

## 2018-12-31 DIAGNOSIS — R51 Headache: Secondary | ICD-10-CM | POA: Insufficient documentation

## 2018-12-31 DIAGNOSIS — R251 Tremor, unspecified: Secondary | ICD-10-CM | POA: Insufficient documentation

## 2018-12-31 DIAGNOSIS — R519 Headache, unspecified: Secondary | ICD-10-CM

## 2018-12-31 HISTORY — DX: Unspecified asthma, uncomplicated: J45.909

## 2018-12-31 MED ORDER — METHOCARBAMOL 500 MG PO TABS: 500.0000 mg | ORAL_TABLET | Freq: Two times a day (BID) | ORAL | 0 refills | Status: DC

## 2018-12-31 NOTE — Discharge Instructions (Signed)

## 2018-12-31 NOTE — ED Notes (Signed)
Pt reports being very shaky- not remembering much but does not think she hit her head or had a LOC. Pt reports upset stomach/ HA at this time.
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
## 2019-01-04 ENCOUNTER — Emergency Department (HOSPITAL_BASED_OUTPATIENT_CLINIC_OR_DEPARTMENT_OTHER)
Admission: EM | Admit: 2019-01-04 | Discharge: 2019-01-04 | Disposition: A | Discharge status: Home / Self Care | Payer: No Typology Code available for payment source | Attending: Emergency Medicine | Admitting: Emergency Medicine

## 2019-01-04 ENCOUNTER — Other Ambulatory Visit: Payer: Self-pay

## 2019-01-04 ENCOUNTER — Encounter (HOSPITAL_BASED_OUTPATIENT_CLINIC_OR_DEPARTMENT_OTHER): Payer: Self-pay | Admitting: *Deleted

## 2019-01-04 DIAGNOSIS — R51 Headache: Secondary | ICD-10-CM | POA: Insufficient documentation

## 2019-01-04 DIAGNOSIS — Y92410 Unspecified street and highway as the place of occurrence of the external cause: Secondary | ICD-10-CM | POA: Insufficient documentation

## 2019-01-04 DIAGNOSIS — Y9389 Activity, other specified: Secondary | ICD-10-CM | POA: Diagnosis not present

## 2019-01-04 DIAGNOSIS — J45909 Unspecified asthma, uncomplicated: Secondary | ICD-10-CM | POA: Diagnosis not present

## 2019-01-04 DIAGNOSIS — R110 Nausea: Secondary | ICD-10-CM | POA: Insufficient documentation

## 2019-01-04 DIAGNOSIS — Z79899 Other long term (current) drug therapy: Secondary | ICD-10-CM | POA: Insufficient documentation

## 2019-01-04 DIAGNOSIS — R1084 Generalized abdominal pain: Secondary | ICD-10-CM | POA: Diagnosis not present

## 2019-01-04 DIAGNOSIS — Y999 Unspecified external cause status: Secondary | ICD-10-CM | POA: Diagnosis not present

## 2019-01-04 DIAGNOSIS — Z5329 Procedure and treatment not carried out because of patient's decision for other reasons: Secondary | ICD-10-CM | POA: Insufficient documentation

## 2019-01-04 DIAGNOSIS — F17290 Nicotine dependence, other tobacco product, uncomplicated: Secondary | ICD-10-CM | POA: Insufficient documentation

## 2019-01-04 LAB — CBC
HCT: 41.4 % (ref 36.0–46.0)
Hemoglobin: 13.6 g/dL (ref 12.0–15.0)
MCH: 29.1 pg (ref 26.0–34.0)
MCHC: 32.9 g/dL (ref 30.0–36.0)
MCV: 88.5 fL (ref 80.0–100.0)
NRBC: 0 % (ref 0.0–0.2)
PLATELETS: 367 10*3/uL (ref 150–400)
RBC: 4.68 MIL/uL (ref 3.87–5.11)
RDW: 12.2 % (ref 11.5–15.5)
WBC: 7.6 10*3/uL (ref 4.0–10.5)

## 2019-01-04 LAB — COMPREHENSIVE METABOLIC PANEL
ALT: 13 U/L (ref 0–44)
AST: 20 U/L (ref 15–41)
Albumin: 4.2 g/dL (ref 3.5–5.0)
Alkaline Phosphatase: 56 U/L (ref 38–126)
Anion gap: 9 (ref 5–15)
BILIRUBIN TOTAL: 0.6 mg/dL (ref 0.3–1.2)
BUN: 9 mg/dL (ref 6–20)
CO2: 25 mmol/L (ref 22–32)
CREATININE: 0.85 mg/dL (ref 0.44–1.00)
Calcium: 9.4 mg/dL (ref 8.9–10.3)
Chloride: 103 mmol/L (ref 98–111)
Glucose, Bld: 92 mg/dL (ref 70–99)
Potassium: 3.7 mmol/L (ref 3.5–5.1)
Sodium: 137 mmol/L (ref 135–145)
TOTAL PROTEIN: 8 g/dL (ref 6.5–8.1)

## 2019-01-04 LAB — URINALYSIS, ROUTINE W REFLEX MICROSCOPIC
BILIRUBIN URINE: NEGATIVE
Glucose, UA: NEGATIVE mg/dL
Hgb urine dipstick: NEGATIVE
KETONES UR: NEGATIVE mg/dL
LEUKOCYTES UA: NEGATIVE
NITRITE: NEGATIVE
PROTEIN: NEGATIVE mg/dL
Specific Gravity, Urine: 1.015 (ref 1.005–1.030)
pH: 8 (ref 5.0–8.0)

## 2019-01-04 LAB — PREGNANCY, URINE: PREG TEST UR: NEGATIVE

## 2019-01-04 LAB — LIPASE, BLOOD: Lipase: 23 U/L (ref 11–51)

## 2019-01-04 MED ORDER — FAMOTIDINE 40 MG/5ML PO SUSR
20.0000 mg | Freq: Once | ORAL | Status: DC
  Filled 2019-01-04: qty 2.5

## 2019-01-04 MED ORDER — SODIUM CHLORIDE 0.9% FLUSH
3.0000 mL | Freq: Once | INTRAVENOUS | Status: DC
  Filled 2019-01-04: qty 3

## 2019-01-04 MED ORDER — FAMOTIDINE 20 MG PO TABS
ORAL_TABLET | ORAL | Status: AC
  Administered 2019-01-04: 20 mg
  Filled 2019-01-04: qty 1
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
## 2019-01-04 NOTE — ED Notes (Signed)
Pt refused to wait for PA to come back to room and stated that she would like to leave AMA at this time.

## 2019-01-04 NOTE — ED Notes (Signed)
Pt asking about the wait time prior to triage.

## 2019-04-18 ENCOUNTER — Other Ambulatory Visit: Payer: Self-pay

## 2019-04-18 ENCOUNTER — Encounter (HOSPITAL_BASED_OUTPATIENT_CLINIC_OR_DEPARTMENT_OTHER): Payer: Self-pay | Admitting: *Deleted

## 2019-04-18 ENCOUNTER — Emergency Department (HOSPITAL_BASED_OUTPATIENT_CLINIC_OR_DEPARTMENT_OTHER)
Admission: EM | Admit: 2019-04-18 | Discharge: 2019-04-18 | Disposition: A | Discharge status: Home / Self Care | Payer: Medicaid Other | Attending: Emergency Medicine | Admitting: Emergency Medicine

## 2019-04-18 DIAGNOSIS — Z87891 Personal history of nicotine dependence: Secondary | ICD-10-CM | POA: Insufficient documentation

## 2019-04-18 DIAGNOSIS — Z79899 Other long term (current) drug therapy: Secondary | ICD-10-CM | POA: Insufficient documentation

## 2019-04-18 DIAGNOSIS — J45909 Unspecified asthma, uncomplicated: Secondary | ICD-10-CM | POA: Insufficient documentation

## 2019-04-18 DIAGNOSIS — K0889 Other specified disorders of teeth and supporting structures: Secondary | ICD-10-CM | POA: Insufficient documentation

## 2019-04-18 MED ORDER — ALBUTEROL SULFATE HFA 108 (90 BASE) MCG/ACT IN AERS
2.0000 | INHALATION_SPRAY | Freq: Once | RESPIRATORY_TRACT | Status: AC
  Administered 2019-04-18: 2 via RESPIRATORY_TRACT
  Filled 2019-04-18: qty 6.7

## 2019-04-18 MED ORDER — PENICILLIN V POTASSIUM 500 MG PO TABS: 500.0000 mg | ORAL_TABLET | Freq: Four times a day (QID) | ORAL | 0 refills | Status: AC

## 2019-04-18 NOTE — ED Provider Notes (Signed)
[facility] EMERGENCY DEPARTMENT Provider Note   CSN: [id number] Arrival date & time: 04/18/19  1700    History   Chief Complaint Chief Complaint  Patient presents with  . Facial Swelling    HPI Shelia Richards is a 23 y.o. female who presents today for right-sided jaw pain and facial swelling for 2 days.  She reports that she has a bad tooth in that area and has been seen for it previously.  She reports that her pain is increased, she is trying Orajel at home without significant relief.  She has been seen by a dentist however reports that it would cost her $500 to get the tooth pulled along with other dental care and she is unable to afford that right now.  She denies any fevers or difficulty swallowing.  She is also requesting an inhaler today.  She says that she does not have the money to get a prescription filled.  She is not currently having any shortness of breath however is out of her other inhaler.     HPI  Past Medical History:  Diagnosis Date  . Asthma   . Bronchitis     There are no active problems to display for this patient.   Past Surgical History:  Procedure Laterality Date  . FINGER SURGERY Left    ring finger, couple years ago     OB History   No obstetric history on file.      Home Medications    Prior to Admission medications   Medication Sig Start Date End Date Taking? Authorizing Provider  albuterol (PROVENTIL HFA;VENTOLIN HFA) 108 (90 Base) MCG/ACT inhaler Inhale 1-2 puffs into the lungs every 6 (six) hours as needed for wheezing or shortness of breath. 08/08/17   Raeford Razor, MD  chlorhexidine (PERIDEX) 0.12 % solution Use as directed 15 mLs in the mouth or throat 2 (two) times daily. Swish and spit.  Do not swallow. 09/01/18   Aviva Kluver B, PA-C  lidocaine (XYLOCAINE) 2 % solution Use as directed 15 mLs in the mouth or throat as needed for mouth pain. Swish and spit.  Do not swallow. 09/01/18   Aviva Kluver B, PA-C  methocarbamol  (ROBAXIN) 500 MG tablet Take 1 tablet (500 mg total) by mouth 2 (two) times daily. 12/31/18   Maxwell Caul, PA-C  naproxen (NAPROSYN) 375 MG tablet Take 1 tablet (375 mg total) by mouth 2 (two) times daily. 09/01/18   Aviva Kluver B, PA-C  ondansetron (ZOFRAN ODT) 4 MG disintegrating tablet Take 1 tablet (4 mg total) by mouth every 8 (eight) hours as needed for nausea or vomiting. 06/24/17   Pricilla Loveless, MD  penicillin v potassium (VEETID) 500 MG tablet Take 1 tablet (500 mg total) by mouth 4 (four) times daily for 7 days. 04/18/19 04/25/19  Cristina Gong, PA-C    Family History Family History  Problem Relation Age of Onset  . Lupus Father   . CAD Father     Social History Social History   Tobacco Use  . Smoking status: Former Smoker    Types: Cigars  . Smokeless tobacco: Never Used  Substance Use Topics  . Alcohol use: Yes    Comment: occ wine  . Drug use: No     Allergies   Patient has no known allergies.   Review of Systems Review of Systems  Constitutional: Negative for chills and fever.  HENT: Positive for dental problem and facial swelling. Negative for congestion, drooling, sore  throat, trouble swallowing and voice change.   Respiratory: Negative for chest tightness and shortness of breath.   Musculoskeletal: Negative for neck pain.  All other systems reviewed and are negative.    Physical Exam Updated Vital Signs BP 122/75 (BP Location: Right Arm)   Pulse 73   Temp 98.8 F (37.1 C) (Oral)   Resp 18   Ht 5\' 5"  (1.651 m)   Wt 74.4 kg   LMP 03/28/2019 (Approximate)   SpO2 100%   BMI 27.29 kg/m   Physical Exam Vitals signs and nursing note reviewed.  Constitutional:      General: She is not in acute distress.    Appearance: She is not ill-appearing.  HENT:     Head:     Comments: Trace edema on the right sided face below the maxilla    Nose: Nose normal.     Mouth/Throat:     Mouth: Mucous membranes are moist.     Comments: Right  maxillary first molar is broken and tender to percussion.  There is no intraoral swelling or fluctuance.  Oropharynx is clear, uvula is midline.  There is no trismus.  No elevation of the floor the mouth. Eyes:     Conjunctiva/sclera: Conjunctivae normal.  Neck:     Musculoskeletal: Normal range of motion and neck supple. No neck rigidity.     Comments: No submandibular edema. Cardiovascular:     Rate and Rhythm: Normal rate.  Pulmonary:     Effort: Pulmonary effort is normal. No respiratory distress.  Lymphadenopathy:     Cervical: No cervical adenopathy.  Neurological:     General: No focal deficit present.     Mental Status: She is alert.      ED Treatments / Results  Labs (all labs ordered are listed, but only abnormal results are displayed) Labs Reviewed - No data to display  EKG None  Radiology No results found.  Procedures Procedures (including critical care time)  Medications Ordered in ED Medications  albuterol (VENTOLIN HFA) 108 (90 Base) MCG/ACT inhaler 2 puff (has no administration in time range)     Initial Impression / Assessment and Plan / ED Course  I have reviewed the triage vital signs and the nursing notes.  Pertinent labs & imaging results that were available during my care of the patient were reviewed by me and considered in my medical decision making (see chart for details).       Patient with toothache.  No gross abscess.  Exam unconcerning for Ludwig's angina or spread of infection.  Will treat with penicillin and OTC pain medicine.  Urged patient to follow-up with dentist as this is a recurrent issue.  She also requested an albuterol inhaler while in the department.  She states that she is not having any symptoms however she is out of her inhaler at home and would not be able to afford the cost at the drugstore.  Albuterol inhaler ordered.  Final Clinical Impressions(s) / ED Diagnoses   Final diagnoses:  Pain, dental    ED Discharge  Orders         Ordered    penicillin v potassium (VEETID) 500 MG tablet  4 times daily     04/18/19 1731           Cristina GongHammond, Banita Lehn W, [place]PA-C 04/18/19 1732    Geoffery Lyonselo, Douglas, MD 04/18/19 2310

## 2019-04-18 NOTE — Discharge Instructions (Signed)

## 2019-04-18 NOTE — ED Triage Notes (Signed)
Pt reports right side jaw pain and facial swelling x 2 days. Upper right tooth pain also

## 2019-04-18 NOTE — ED Notes (Signed)
ED Provider at bedside. 

## 2019-04-19 ENCOUNTER — Other Ambulatory Visit: Payer: Self-pay

## 2019-04-19 ENCOUNTER — Emergency Department (HOSPITAL_BASED_OUTPATIENT_CLINIC_OR_DEPARTMENT_OTHER)
Admission: EM | Admit: 2019-04-19 | Discharge: 2019-04-19 | Disposition: A | Discharge status: Home / Self Care | Payer: Medicaid Other | Attending: Emergency Medicine | Admitting: Emergency Medicine

## 2019-04-19 ENCOUNTER — Encounter (HOSPITAL_BASED_OUTPATIENT_CLINIC_OR_DEPARTMENT_OTHER): Payer: Self-pay | Admitting: Emergency Medicine

## 2019-04-19 DIAGNOSIS — J45909 Unspecified asthma, uncomplicated: Secondary | ICD-10-CM | POA: Insufficient documentation

## 2019-04-19 DIAGNOSIS — K0889 Other specified disorders of teeth and supporting structures: Secondary | ICD-10-CM | POA: Insufficient documentation

## 2019-04-19 DIAGNOSIS — Z87891 Personal history of nicotine dependence: Secondary | ICD-10-CM | POA: Insufficient documentation

## 2019-04-19 DIAGNOSIS — Z79899 Other long term (current) drug therapy: Secondary | ICD-10-CM | POA: Insufficient documentation

## 2019-04-19 MED ORDER — KETOROLAC TROMETHAMINE 30 MG/ML IJ SOLN
30.0000 mg | Freq: Once | INTRAMUSCULAR | Status: AC
  Administered 2019-04-19: 30 mg via INTRAMUSCULAR
  Filled 2019-04-19: qty 1

## 2019-04-19 MED ORDER — NAPROXEN 500 MG PO TABS: 500.0000 mg | ORAL_TABLET | Freq: Two times a day (BID) | ORAL | 0 refills | Status: DC

## 2019-04-19 NOTE — ED Triage Notes (Signed)
Pt arrives back to ED for worsening facial swelling 1+ to R side of face. States she started penicillin yesterday (seen in our ED). Speaking in full sentences, no airway involvement. Now asking about when she can "just go home" Has not taken anything for pain or for swelling.

## 2019-04-19 NOTE — Discharge Instructions (Addendum)
You were seen today again for dental pain.  Continue taking her antibiotics as directed.  Call a dentist first thing in the morning.  Take naproxen as needed for pain.  If you develop a temperature greater than 100.4, difficulty swallowing, neck swelling, any new or worsening symptoms you should be reevaluated.

## 2019-04-19 NOTE — ED Provider Notes (Signed)
[facility] EMERGENCY DEPARTMENT Provider Note   CSN: [id number] Arrival date & time: 04/19/19  0047    History   Chief Complaint Chief Complaint  Patient presents with  . Facial Pain    recheck for dental pain, seen yesterday - reports worsening swelling and pain    HPI Shelia Richards is a 24 y.o. female.     HPI  This is a 23 year old female who presents with persistent right facial pain.  Patient was seen and evaluated at 5:30 PM yesterday for dental pain.  At that time she was prescribed penicillin and told to take NSAIDs.  She reports that she has taken penicillin but nothing for pain.  Since that time she reports worsening right side facial swelling and pain.  She reports "it has a fever."  But denies any objective temperatures at home.  She reports pain with eating.  No difficulty swallowing.  Rates her pain at 10 out of 10.  She does not have a dentist but was given dental resources.  Patient reports that she does not like to take medications and that is why she has not taken anything at home for pain.  Past Medical History:  Diagnosis Date  . Asthma   . Bronchitis     There are no active problems to display for this patient.   Past Surgical History:  Procedure Laterality Date  . FINGER SURGERY Left    ring finger, couple years ago     OB History   No obstetric history on file.      Home Medications    Prior to Admission medications   Medication Sig Start Date End Date Taking? Authorizing Provider  penicillin v potassium (VEETID) 500 MG tablet Take 1 tablet (500 mg total) by mouth 4 (four) times daily for 7 days. 04/18/19 04/25/19 Yes Cristina Gong, PA-C  albuterol (PROVENTIL HFA;VENTOLIN HFA) 108 (90 Base) MCG/ACT inhaler Inhale 1-2 puffs into the lungs every 6 (six) hours as needed for wheezing or shortness of breath. 08/08/17   Raeford Razor, MD  chlorhexidine (PERIDEX) 0.12 % solution Use as directed 15 mLs in the mouth or throat 2 (two)  times daily. Swish and spit.  Do not swallow. 09/01/18   Aviva Kluver B, PA-C  lidocaine (XYLOCAINE) 2 % solution Use as directed 15 mLs in the mouth or throat as needed for mouth pain. Swish and spit.  Do not swallow. 09/01/18   Aviva Kluver B, PA-C  methocarbamol (ROBAXIN) 500 MG tablet Take 1 tablet (500 mg total) by mouth 2 (two) times daily. 12/31/18   Maxwell Caul, PA-C  naproxen (NAPROSYN) 500 MG tablet Take 1 tablet (500 mg total) by mouth 2 (two) times daily. 04/19/19   Alisha Burgo, Mayer Masker, MD  ondansetron (ZOFRAN ODT) 4 MG disintegrating tablet Take 1 tablet (4 mg total) by mouth every 8 (eight) hours as needed for nausea or vomiting. 06/24/17   Pricilla Loveless, MD    Family History Family History  Problem Relation Age of Onset  . Lupus Father   . CAD Father     Social History Social History   Tobacco Use  . Smoking status: Former Smoker    Types: Cigars  . Smokeless tobacco: Never Used  Substance Use Topics  . Alcohol use: Yes    Comment: occ wine  . Drug use: No     Allergies   Patient has no known allergies.   Review of Systems Review of Systems  Constitutional: Negative for  fever.  HENT: Positive for dental problem and facial swelling. Negative for trouble swallowing.   Respiratory: Negative for shortness of breath.   Cardiovascular: Negative for chest pain.  Skin: Negative for color change.  All other systems reviewed and are negative.    Physical Exam Updated Vital Signs BP 128/67   Pulse 99   Temp 98.8 F (37.1 C)   Resp 18   Ht 1.651 m (5\' 5" )   Wt 74.4 kg   LMP 03/28/2019 (Approximate)   SpO2 100%   BMI 27.29 kg/m   Physical Exam Vitals signs and nursing note reviewed.  Constitutional:      Appearance: She is well-developed.     Comments: Tearful but nontoxic-appearing, no acute distress, ABCs intact  HENT:     Head: Normocephalic and atraumatic.     Comments: No trismus noted, no swelling noted under the tongue, slight right-sided  facial swelling, no overlying erythema    Mouth/Throat:     Mouth: Mucous membranes are moist.     Dentition: Dental tenderness present. No dental abscesses.     Tongue: Tongue does not deviate from midline.     Pharynx: Oropharynx is clear.   Eyes:     Pupils: Pupils are equal, round, and reactive to light.  Cardiovascular:     Rate and Rhythm: Normal rate and regular rhythm.     Heart sounds: Normal heart sounds.  Pulmonary:     Effort: Pulmonary effort is normal. No respiratory distress.     Breath sounds: No wheezing.  Skin:    General: Skin is warm and dry.  Neurological:     Mental Status: She is alert and oriented to person, place, and time.  Psychiatric:     Comments: Tearful and anxious appearing      ED Treatments / Results  Labs (all labs ordered are listed, but only abnormal results are displayed) Labs Reviewed - No data to display  EKG None  Radiology No results found.  Procedures Procedures (including critical care time)  Medications Ordered in ED Medications  ketorolac (TORADOL) 30 MG/ML injection 30 mg (30 mg Intramuscular Given 04/19/19 0115)     Initial Impression / Assessment and Plan / ED Course  I have reviewed the triage vital signs and the nursing notes.  Pertinent labs & imaging results that were available during my care of the patient were reviewed by me and considered in my medical decision making (see chart for details).        Patient presents with persistent dental pain.  She is very tearful and anxious on exam.  She has no obvious facial cellulitis or significant facial swelling.  No signs or symptoms of Ludwick's.  She has not taken pain medication at home.  She appears anxious out of proportion to her symptoms.  I discussed with her if anything else was going on and she denies.  Patient was given IM Toradol.  She is requesting discharge after Toradol.  Patient was given a prescription for naproxen.  I encouraged her to follow-up  with dentistry first thing in the morning.  After history, exam, and medical workup I feel the patient has been appropriately medically screened and is safe for discharge home. Pertinent diagnoses were discussed with the patient. Patient was given return precautions.   Final Clinical Impressions(s) / ED Diagnoses   Final diagnoses:  Pain, dental    ED Discharge Orders         Ordered    naproxen (NAPROSYN)  500 MG tablet  2 times daily     04/19/19 0149           Maxmilian Trostel, Mayer Masker, MD 04/19/19 [id number]

## 2019-04-19 NOTE — ED Notes (Signed)
Pt crying and hyperventilating in room upon admin of toradol, second staff member required to redirect and assist patient in coping. Very anxious and upset.

## 2019-04-20 ENCOUNTER — Encounter (HOSPITAL_BASED_OUTPATIENT_CLINIC_OR_DEPARTMENT_OTHER): Payer: Self-pay

## 2019-04-20 ENCOUNTER — Other Ambulatory Visit: Payer: Self-pay

## 2019-04-20 ENCOUNTER — Emergency Department (HOSPITAL_BASED_OUTPATIENT_CLINIC_OR_DEPARTMENT_OTHER)
Admission: EM | Admit: 2019-04-20 | Discharge: 2019-04-20 | Disposition: A | Discharge status: Home / Self Care | Payer: Medicaid Other | Attending: Emergency Medicine | Admitting: Emergency Medicine

## 2019-04-20 DIAGNOSIS — Z87891 Personal history of nicotine dependence: Secondary | ICD-10-CM | POA: Insufficient documentation

## 2019-04-20 DIAGNOSIS — K047 Periapical abscess without sinus: Secondary | ICD-10-CM | POA: Insufficient documentation

## 2019-04-20 DIAGNOSIS — J45909 Unspecified asthma, uncomplicated: Secondary | ICD-10-CM | POA: Insufficient documentation

## 2019-04-20 DIAGNOSIS — Z79899 Other long term (current) drug therapy: Secondary | ICD-10-CM | POA: Insufficient documentation

## 2019-04-20 MED ORDER — CLINDAMYCIN HCL 150 MG PO CAPS: 450.0000 mg | ORAL_CAPSULE | Freq: Three times a day (TID) | ORAL | 0 refills | Status: AC

## 2019-04-20 MED ORDER — ONDANSETRON 4 MG PO TBDP
4.0000 mg | ORAL_TABLET | Freq: Once | ORAL | Status: AC
  Administered 2019-04-20: 4 mg via ORAL
  Filled 2019-04-20: qty 1

## 2019-04-20 MED ORDER — LORAZEPAM 1 MG PO TABS
2.0000 mg | ORAL_TABLET | Freq: Once | ORAL | Status: DC
  Filled 2019-04-20: qty 2

## 2019-04-20 MED ORDER — BUPIVACAINE-EPINEPHRINE (PF) 0.5% -1:200000 IJ SOLN
INTRAMUSCULAR | Status: AC
  Filled 2019-04-20: qty 1.8

## 2019-04-20 MED ORDER — LORAZEPAM 1 MG PO TABS
1.0000 mg | ORAL_TABLET | Freq: Once | ORAL | Status: AC
  Administered 2019-04-20: 1 mg via ORAL

## 2019-04-20 MED ORDER — ACETAMINOPHEN 325 MG PO TABS
650.0000 mg | ORAL_TABLET | Freq: Once | ORAL | Status: AC
  Administered 2019-04-20: 14:00:00 650 mg via ORAL
  Filled 2019-04-20: qty 2

## 2019-04-20 MED ORDER — BUPIVACAINE-EPINEPHRINE (PF) 0.5% -1:200000 IJ SOLN
10.0000 mL | Freq: Once | INTRAMUSCULAR | Status: AC
  Administered 2019-04-20: 13:00:00 10 mL
  Filled 2019-04-20: qty 10.8

## 2019-04-20 MED ORDER — CHLORHEXIDINE GLUCONATE 0.12% ORAL RINSE (MEDLINE KIT): 15.0000 mL | Freq: Two times a day (BID) | OROMUCOSAL | 0 refills | Status: AC

## 2019-04-20 MED FILL — NAPROXEN 500 MG TABLET: 500 | 15 days supply | Qty: 30 | Fill #0

## 2019-04-20 MED FILL — CHLORHEXIDINE 0.12% RINSE: 0.12 | 16 days supply | Qty: 473 | Fill #0

## 2019-04-20 MED FILL — CLINDAMYCIN HCL 150 MG CAPS: 150 | 7 days supply | Qty: 63 | Fill #0

## 2019-04-20 NOTE — ED Notes (Signed)
ED Provider at bedside. 

## 2019-04-20 NOTE — ED Notes (Signed)
Pt given water for PO challenge 

## 2019-04-20 NOTE — Discharge Instructions (Addendum)
Stop taking the penicillin.  Start taking clindamycin instead and finish the antibiotic until it is complete.  You may also use the mouthwash.  Please follow-up with a dentist in the next 1-2 days for reevaluation.  If you do not have a dentist, resources were provided for dentist in the area in your discharge summary.  Please contact one of the offices that are listed and make an appointment for follow-up.  Please return to the emergency department for any new or worsening symptoms.

## 2019-04-20 NOTE — ED Notes (Signed)
C/o rt sided tooth pain  Has been seen several times for same  Is on pcn  States woke this am w nausea and increased swelling

## 2019-04-20 NOTE — ED Triage Notes (Signed)
Pt c/o right side facial swelling-states she has been seen for same x last 2 days-states she is now vomiting x this am-NAD-steady gait

## 2019-04-20 NOTE — ED Provider Notes (Signed)
[facility] EMERGENCY DEPARTMENT Provider Note   CSN: [id number] Arrival date & time: 04/20/19  1157    History   Chief Complaint Chief Complaint  Patient presents with  . Facial Swelling    HPI Shelia Richards is a 23 y.o. female.     HPI   Patient is a 23 year old female with a history of asthma and bronchitis who presents to the emergency department today for evaluation of right-sided facial swelling and pain that has been present for the last several days.   Reviewed records, patient has been evaluated for dental pain 3 times in the last 3 days. Seen on 5/10 and started on pen vk. She was seen again yesterday for persistent pain and swelling.   She states swelling has persisted despite taking antibiotic.  She states she is now developed diarrhea and was also vomiting (x2) this morning.  She does admit that she was taking penicillin inappropriately.  The first day that she started the medication she was taking it every 2 hours.  No fevers.  No abdominal pain.  Past Medical History:  Diagnosis Date  . Asthma   . Bronchitis     There are no active problems to display for this patient.   Past Surgical History:  Procedure Laterality Date  . FINGER SURGERY Left    ring finger, couple years ago     OB History   No obstetric history on file.      Home Medications    Prior to Admission medications   Medication Sig Start Date End Date Taking? Authorizing Provider  albuterol (PROVENTIL HFA;VENTOLIN HFA) 108 (90 Base) MCG/ACT inhaler Inhale 1-2 puffs into the lungs every 6 (six) hours as needed for wheezing or shortness of breath. 08/08/17   Virgel Manifold, MD  chlorhexidine gluconate, MEDLINE KIT, (PERIDEX) 0.12 % solution Use as directed 15 mLs in the mouth or throat 2 (two) times daily for 5 days. 04/20/19 04/25/19  Vyolet Sakuma S, PA-C  clindamycin (CLEOCIN) 150 MG capsule Take 3 capsules (450 mg total) by mouth 3 (three) times daily for 7 days. 04/20/19  04/27/19  Shamara Soza S, PA-C  lidocaine (XYLOCAINE) 2 % solution Use as directed 15 mLs in the mouth or throat as needed for mouth pain. Swish and spit.  Do not swallow. 09/01/18   Langston Masker B, PA-C  methocarbamol (ROBAXIN) 500 MG tablet Take 1 tablet (500 mg total) by mouth 2 (two) times daily. 12/31/18   Volanda Napoleon, PA-C  naproxen (NAPROSYN) 500 MG tablet Take 1 tablet (500 mg total) by mouth 2 (two) times daily. 04/19/19   Horton, Barbette Hair, MD  ondansetron (ZOFRAN ODT) 4 MG disintegrating tablet Take 1 tablet (4 mg total) by mouth every 8 (eight) hours as needed for nausea or vomiting. 06/24/17   Sherwood Gambler, MD  penicillin v potassium (VEETID) 500 MG tablet Take 1 tablet (500 mg total) by mouth 4 (four) times daily for 7 days. 04/18/19 04/25/19  Lorin Glass, PA-C    Family History Family History  Problem Relation Age of Onset  . Lupus Father   . CAD Father     Social History Social History   Tobacco Use  . Smoking status: Former Smoker    Types: Cigars  . Smokeless tobacco: Never Used  Substance Use Topics  . Alcohol use: Yes    Comment: occ wine  . Drug use: No     Allergies   Patient has no known allergies.  Review of Systems Review of Systems  Constitutional: Negative for fever.  HENT: Positive for dental problem. Negative for trouble swallowing.   Eyes: Negative for visual disturbance.  Respiratory: Negative for shortness of breath.   Cardiovascular: Negative for chest pain.  Gastrointestinal: Positive for diarrhea and vomiting. Negative for abdominal pain.  Musculoskeletal: Negative for back pain.     Physical Exam Updated Vital Signs BP 108/75   Pulse 84   Temp 98.3 F (36.8 C) (Oral)   Resp 18   Ht 5' 5"  (1.651 m)   Wt 74.8 kg   LMP 03/28/2019 (Approximate)   SpO2 99%   BMI 27.46 kg/m   Physical Exam Vitals signs and nursing note reviewed.  Constitutional:      General: She is not in acute distress.    Appearance: She  is well-developed.  HENT:     Head: Normocephalic and atraumatic.     Mouth/Throat:     Mouth: Mucous membranes are moist.     Pharynx: No oropharyngeal exudate or posterior oropharyngeal erythema.     Comments: TTP with fluctuance noted to the right upper gumline. TTP to the adjacent tooth. No trismus. No TTP or edema to the sublingual space. No TTP or swelling beneath the mandible or along the neck.  Eyes:     Conjunctiva/sclera: Conjunctivae normal.  Neck:     Musculoskeletal: Neck supple.  Cardiovascular:     Rate and Rhythm: Normal rate and regular rhythm.     Heart sounds: No murmur.  Pulmonary:     Effort: Pulmonary effort is normal. No respiratory distress.     Breath sounds: Normal breath sounds.  Abdominal:     Palpations: Abdomen is soft.     Tenderness: There is no abdominal tenderness.  Skin:    General: Skin is warm and dry.  Neurological:     Mental Status: She is alert.      ED Treatments / Results  Labs (all labs ordered are listed, but only abnormal results are displayed) Labs Reviewed - No data to display  EKG None  Radiology No results found.  Procedures .Marland KitchenIncision and Drainage Date/Time: 04/20/2019 3:24 PM Performed by: Rodney Booze, PA-C Authorized by: Rodney Booze, PA-C   Consent:    Consent obtained:  Verbal   Consent given by:  Patient   Risks discussed:  Bleeding, incomplete drainage and pain   Alternatives discussed:  No treatment Location:    Type:  Abscess   Size:  1   Location:  Mouth Anesthesia (see MAR for exact dosages):    Anesthesia method:  Topical application   Topical anesthetic:  Benzocaine gel Post-procedure details:    Patient tolerance of procedure:  Procedure terminated at patient's request (mult attempts made, pt unable to tolerate) .Nerve Block Date/Time: 04/20/2019 3:26 PM Performed by: Rodney Booze, PA-C Authorized by: Rodney Booze, PA-C   Consent:    Consent obtained:  Verbal   Consent  given by:  Patient   Risks discussed:  Pain and unsuccessful block   Alternatives discussed:  Alternative treatment Indications:    Indications:  Pain relief and procedural anesthesia Location:    Nerve block body site: mouth. Skin anesthesia (see MAR for exact dosages):    Skin anesthesia method:  Topical application   Topical anesthetic:  Benzocaine gel Procedure details (see MAR for exact dosages):    Block needle gauge:  27 G   Block anesthetic: none.   Injection procedure:  Anatomic landmarks  identified and anatomic landmarks palpated Post-procedure details:    Patient tolerance of procedure:  Procedure terminated at patient's request (multiple attempts made, pt unable to tolerate)   (including critical care time)  Medications Ordered in ED Medications  ondansetron (ZOFRAN-ODT) disintegrating tablet 4 mg (4 mg Oral Given 04/20/19 1249)  bupivacaine-epinephrine (MARCAINE W/ EPI) 0.5% -1:200000 injection 10 mL (10 mLs Infiltration Given 04/20/19 1311)  LORazepam (ATIVAN) tablet 1 mg (1 mg Oral Given 04/20/19 1311)  acetaminophen (TYLENOL) tablet 650 mg (650 mg Oral Given 04/20/19 1344)     Initial Impression / Assessment and Plan / ED Course  I have reviewed the triage vital signs and the nursing notes.  Pertinent labs & imaging results that were available during my care of the patient were reviewed by me and considered in my medical decision making (see chart for details).    Final Clinical Impressions(s) / ED Diagnoses   Final diagnoses:  Dental abscess   Pt here with persistent dental pain after taking pen vk. She has developed area of fluctuance to the upper gumline on the right upper mouth.   Pt initially opted to use bupivacaine gel alone as anesthetetic, upon attempting the procedure she requested a dental block. Also requested something for anxiety. Ativan given. Also given zofran for nausea and tylenol for pain.   On reassessment, patient states she feels somewhat  more calm after Ativan, however she is still very anxious and tearful.  Discussed the procedure with patient, patient was unable to sit still and was moving around on exam table as well as grabbing my hands and pushing the way and attempt to do the procedure.  Discussed with patient that this creates an unsafe environment and if unable to lay still or keep her hands at her sides then I would be unable to complete the procedure safely.  Multiple attempts were made to complete the procedure and patient was not able to tolerate and continue to create an unsafe environment.  Discussed alternative treatment of switching antibiotic to clindamycin and providing mouth rinse.  She was again given referral to a dentist.  I advised her to call in office for follow-up tomorrow morning.  Advised to return if worse.  Patient voices understanding of plan and reasons return.  All questions answered.  Patient stable for discharge.  ED Discharge Orders         Ordered    chlorhexidine gluconate, MEDLINE KIT, (PERIDEX) 0.12 % solution  2 times daily     04/20/19 1444    clindamycin (CLEOCIN) 150 MG capsule  3 times daily     04/20/19 1444           Amore Ackman S, PA-C 04/20/19 [place], [place], DO 04/22/19 [phone]

## 2019-04-21 ENCOUNTER — Other Ambulatory Visit: Payer: Self-pay

## 2019-04-21 ENCOUNTER — Emergency Department (HOSPITAL_BASED_OUTPATIENT_CLINIC_OR_DEPARTMENT_OTHER)
Admission: EM | Admit: 2019-04-21 | Discharge: 2019-04-21 | Disposition: A | Discharge status: Home / Self Care | Payer: Medicaid Other | Attending: Emergency Medicine | Admitting: Emergency Medicine

## 2019-04-21 ENCOUNTER — Encounter (HOSPITAL_BASED_OUTPATIENT_CLINIC_OR_DEPARTMENT_OTHER): Payer: Self-pay | Admitting: Emergency Medicine

## 2019-04-21 DIAGNOSIS — Z87891 Personal history of nicotine dependence: Secondary | ICD-10-CM | POA: Insufficient documentation

## 2019-04-21 DIAGNOSIS — K047 Periapical abscess without sinus: Secondary | ICD-10-CM | POA: Insufficient documentation

## 2019-04-21 DIAGNOSIS — R112 Nausea with vomiting, unspecified: Secondary | ICD-10-CM | POA: Insufficient documentation

## 2019-04-21 DIAGNOSIS — J45909 Unspecified asthma, uncomplicated: Secondary | ICD-10-CM | POA: Insufficient documentation

## 2019-04-21 MED ORDER — ONDANSETRON 4 MG PO TBDP: 4.0000 mg | ORAL_TABLET | Freq: Three times a day (TID) | ORAL | 0 refills | Status: DC | PRN

## 2019-04-21 NOTE — Discharge Instructions (Signed)
Your dental pain and swelling seem to be improving. Continue your antibiotic and take the Zofran as needed for nause and/or vomiting. Call the dentist listed to arrange the next available follow up appointment. Return to the ED if symptoms worsen.

## 2019-04-21 NOTE — ED Triage Notes (Signed)
Pt c/o continued RT side facial swelling

## 2019-04-21 NOTE — ED Provider Notes (Signed)
Emergency Department Provider Note   I have reviewed the triage vital signs and the nursing notes.   HISTORY  Chief Complaint Facial Swelling   HPI Shelia Richards is a 23 y.o. female with PMH of asthma turns to the emergency department with right face swelling and nausea/vomiting.  Patient has been seen in the emergency department multiple times in the last 3 days.  She was initially started on penicillin and then transition to clindamycin yesterday.  She had a failed attempt at abscess drainage due to pain.  Procedure was aborted.  She was given clindamycin and states that her pain has significantly improved.  She did have nausea and vomiting this morning.  Patient did not have nausea medication to take and so presents to the emergency department today.  She denies any abdominal pain.  No diarrhea.  No fevers.  No difficulty breathing, swallowing, voice changes.  She called the dental provider listed and has been referred to a separate practice.   Past Medical History:  Diagnosis Date  . Asthma   . Bronchitis     There are no active problems to display for this patient.   Past Surgical History:  Procedure Laterality Date  . FINGER SURGERY Left    ring finger, couple years ago    Allergies Patient has no known allergies.  Family History  Problem Relation Age of Onset  . Lupus Father   . CAD Father     Social History Social History   Tobacco Use  . Smoking status: Former Smoker    Types: Cigars  . Smokeless tobacco: Never Used  Substance Use Topics  . Alcohol use: Yes    Comment: occ wine  . Drug use: No    Review of Systems  Constitutional: No fever/chills Eyes: No visual changes. ENT: No sore throat. Positive dental pain.  Cardiovascular: Denies chest pain. Respiratory: Denies shortness of breath. Gastrointestinal: No abdominal pain. Positive nausea and vomiting.  No diarrhea.  No constipation. Genitourinary: Negative for dysuria. Musculoskeletal:  Negative for back pain. Skin: Negative for rash. Neurological: Negative for headaches, focal weakness or numbness.  10-point ROS otherwise negative.  ____________________________________________   PHYSICAL EXAM:  VITAL SIGNS: ED Triage Vitals  Enc Vitals Group     BP 04/21/19 1237 127/73     Pulse Rate 04/21/19 1237 94     Resp 04/21/19 1237 16     Temp 04/21/19 1237 98.4 F (36.9 C)     Temp Source 04/21/19 1237 Oral     SpO2 04/21/19 1237 100 %     Pain Score 04/21/19 1247 9   Constitutional: Alert and oriented. Well appearing and in no acute distress. Eyes: Conjunctivae are normal. Head: Atraumatic. Nose: No congestion/rhinnorhea. Mouth/Throat: Mucous membranes are moist.  Oropharynx non-erythematous. Mild swelling at the base of the right upper incisor. No drainage. Minimal fluctuance. No trismus. Widely patent oropharynx with clear voice.  Neck: No stridor.  Cardiovascular: Normal rate, regular rhythm.  Respiratory: Normal respiratory effort.   Gastrointestinal: No distention.  Musculoskeletal: No gross deformities of extremities. Neurologic:  Normal speech and language. Skin:  Skin is warm, dry and intact. No rash noted.  ____________________________________________   PROCEDURES  Procedure(s) performed:   Procedures  None  ____________________________________________   INITIAL IMPRESSION / ASSESSMENT AND PLAN / ED COURSE  Pertinent labs & imaging results that were available during my care of the patient were reviewed by me and considered in my medical decision making (see chart for details).  Patient presents to the emergency department with right face swelling and nausea/vomiting.  She is very well-appearing here.  Significant improvement in her pain subjectively.  She tolerates the exam without difficulty.  She has minimal swelling at the base of her right upper incisor without significant fluctuance or drainage.  At this time, given the difficulty  yesterday, and her clinical improvement since starting a new antibiotic, I do not feel that reattempt at I&D is indicated at this time.  No CT imaging of the face with improved symptoms.  Patient is making contact with dentistry to follow-up as an outpatient.  No concern for deeper space neck infection or developing Ludwig's angina.  Discussed continuing her clindamycin.  Provided prescription for Zofran.  Discussed ED return precautions along with need for dental follow-up.  Patient verbalizes understanding of plan.    ____________________________________________  FINAL CLINICAL IMPRESSION(S) / ED DIAGNOSES  Final diagnoses:  Dental infection    Note:  This document was prepared using Dragon voice recognition software and may include unintentional dictation errors.  Alona Bene, MD Emergency Medicine    Gretel Cantu, Arlyss Repress, MD 04/21/19 1357

## 2020-06-14 ENCOUNTER — Other Ambulatory Visit: Payer: Self-pay

## 2020-06-14 ENCOUNTER — Emergency Department (HOSPITAL_BASED_OUTPATIENT_CLINIC_OR_DEPARTMENT_OTHER)
Admission: EM | Admit: 2020-06-14 | Discharge: 2020-06-14 | Disposition: A | Discharge status: Home / Self Care | Payer: Medicaid Other | Attending: Emergency Medicine | Admitting: Emergency Medicine

## 2020-06-14 ENCOUNTER — Encounter (HOSPITAL_BASED_OUTPATIENT_CLINIC_OR_DEPARTMENT_OTHER): Payer: Self-pay | Admitting: Emergency Medicine

## 2020-06-14 DIAGNOSIS — R531 Weakness: Secondary | ICD-10-CM | POA: Insufficient documentation

## 2020-06-14 DIAGNOSIS — R42 Dizziness and giddiness: Secondary | ICD-10-CM | POA: Insufficient documentation

## 2020-06-14 DIAGNOSIS — Z5321 Procedure and treatment not carried out due to patient leaving prior to being seen by health care provider: Secondary | ICD-10-CM | POA: Insufficient documentation

## 2020-06-14 NOTE — ED Triage Notes (Signed)
A few hours ago pt became weak and lightheaded.  Sts she did not sleep last night. Does not know why. Didn't feel well yesterday.  Sts she was a friend's house today and was very weak and "could not pick my head up."  Sts she had her friends help her to the car and she drove here. Pt poor historian of situation.

## 2020-10-19 ENCOUNTER — Other Ambulatory Visit: Payer: Self-pay

## 2020-10-19 ENCOUNTER — Emergency Department (HOSPITAL_BASED_OUTPATIENT_CLINIC_OR_DEPARTMENT_OTHER)
Admission: EM | Admit: 2020-10-19 | Discharge: 2020-10-19 | Disposition: A | Discharge status: Other Acute Inpatient Hospital | Payer: Medicaid Other | Attending: Emergency Medicine | Admitting: Emergency Medicine

## 2020-10-19 ENCOUNTER — Encounter (HOSPITAL_BASED_OUTPATIENT_CLINIC_OR_DEPARTMENT_OTHER): Payer: Self-pay | Admitting: *Deleted

## 2020-10-19 ENCOUNTER — Inpatient Hospital Stay (HOSPITAL_COMMUNITY)
Admission: RE | Admit: 2020-10-19 | Discharge: 2020-10-21 | DRG: 880 | Disposition: A | Discharge status: Home / Self Care | Payer: Federal, State, Local not specified - Other | Attending: Psychiatry | Admitting: Psychiatry

## 2020-10-19 ENCOUNTER — Other Ambulatory Visit: Payer: Self-pay | Admitting: Psychiatric/Mental Health

## 2020-10-19 DIAGNOSIS — F1290 Cannabis use, unspecified, uncomplicated: Secondary | ICD-10-CM | POA: Diagnosis present

## 2020-10-19 DIAGNOSIS — F17290 Nicotine dependence, other tobacco product, uncomplicated: Secondary | ICD-10-CM | POA: Diagnosis present

## 2020-10-19 DIAGNOSIS — R45851 Suicidal ideations: Secondary | ICD-10-CM | POA: Diagnosis present

## 2020-10-19 DIAGNOSIS — F332 Major depressive disorder, recurrent severe without psychotic features: Secondary | ICD-10-CM | POA: Insufficient documentation

## 2020-10-19 DIAGNOSIS — Z62810 Personal history of physical and sexual abuse in childhood: Secondary | ICD-10-CM | POA: Diagnosis present

## 2020-10-19 DIAGNOSIS — J45909 Unspecified asthma, uncomplicated: Secondary | ICD-10-CM | POA: Insufficient documentation

## 2020-10-19 DIAGNOSIS — Z91018 Allergy to other foods: Secondary | ICD-10-CM | POA: Diagnosis not present

## 2020-10-19 DIAGNOSIS — F322 Major depressive disorder, single episode, severe without psychotic features: Secondary | ICD-10-CM

## 2020-10-19 DIAGNOSIS — Z87891 Personal history of nicotine dependence: Secondary | ICD-10-CM | POA: Insufficient documentation

## 2020-10-19 DIAGNOSIS — F430 Acute stress reaction: Principal | ICD-10-CM | POA: Diagnosis present

## 2020-10-19 DIAGNOSIS — Z20822 Contact with and (suspected) exposure to covid-19: Secondary | ICD-10-CM | POA: Insufficient documentation

## 2020-10-19 LAB — RESPIRATORY PANEL BY RT PCR (FLU A&B, COVID)
Influenza A by PCR: NEGATIVE
Influenza B by PCR: NEGATIVE
SARS Coronavirus 2 by RT PCR: NEGATIVE

## 2020-10-19 LAB — PREGNANCY, URINE: Preg Test, Ur: NEGATIVE

## 2020-10-19 LAB — CBC
HCT: 41.6 % (ref 36.0–46.0)
Hemoglobin: 13.9 g/dL (ref 12.0–15.0)
MCH: 29.4 pg (ref 26.0–34.0)
MCHC: 33.4 g/dL (ref 30.0–36.0)
MCV: 87.9 fL (ref 80.0–100.0)
Platelets: 273 10*3/uL (ref 150–400)
RBC: 4.73 MIL/uL (ref 3.87–5.11)
RDW: 12.2 % (ref 11.5–15.5)
WBC: 5.9 10*3/uL (ref 4.0–10.5)
nRBC: 0 % (ref 0.0–0.2)

## 2020-10-19 LAB — COMPREHENSIVE METABOLIC PANEL
ALT: 13 U/L (ref 0–44)
AST: 18 U/L (ref 15–41)
Albumin: 4.2 g/dL (ref 3.5–5.0)
Alkaline Phosphatase: 50 U/L (ref 38–126)
Anion gap: 10 (ref 5–15)
BUN: 8 mg/dL (ref 6–20)
CO2: 24 mmol/L (ref 22–32)
Calcium: 9.1 mg/dL (ref 8.9–10.3)
Chloride: 106 mmol/L (ref 98–111)
Creatinine, Ser: 0.76 mg/dL (ref 0.44–1.00)
GFR, Estimated: >60 mL/min (ref >60)
Glucose, Bld: 86 mg/dL (ref 70–99)
Potassium: 3.6 mmol/L (ref 3.5–5.1)
Sodium: 140 mmol/L (ref 135–145)
Total Bilirubin: 0.8 mg/dL (ref 0.3–1.2)
Total Protein: 8 g/dL (ref 6.5–8.1)

## 2020-10-19 LAB — RAPID URINE DRUG SCREEN, HOSP PERFORMED
Amphetamines: NOT DETECTED
Barbiturates: NOT DETECTED
Benzodiazepines: NOT DETECTED
Cocaine: NOT DETECTED
Opiates: NOT DETECTED
Tetrahydrocannabinol: POSITIVE — AB

## 2020-10-19 LAB — SALICYLATE LEVEL: Salicylate Lvl: <7 mg/dL — ABNORMAL LOW (ref 7.0–30.0)

## 2020-10-19 LAB — ACETAMINOPHEN LEVEL: Acetaminophen (Tylenol), Serum: <10 ug/mL — ABNORMAL LOW (ref 10–30)

## 2020-10-19 LAB — ETHANOL: Alcohol, Ethyl (B): <10 mg/dL (ref <10)

## 2020-10-19 MED ORDER — LORAZEPAM 1 MG PO TABS: 1.0000 mg | ORAL_TABLET | ORAL | Status: DC | PRN

## 2020-10-19 MED ORDER — MAGNESIUM HYDROXIDE 400 MG/5ML PO SUSP: 30.0000 mL | Freq: Every day | ORAL | Status: DC | PRN

## 2020-10-19 MED ORDER — TRAZODONE HCL 50 MG PO TABS
50.0000 mg | ORAL_TABLET | Freq: Every evening | ORAL | Status: DC | PRN
  Filled 2020-10-19 (×2): qty 1

## 2020-10-19 MED ORDER — OLANZAPINE 5 MG PO TBDP: 5.0000 mg | ORAL_TABLET | Freq: Three times a day (TID) | ORAL | Status: DC | PRN

## 2020-10-19 MED ORDER — HYDROXYZINE HCL 25 MG PO TABS
25.0000 mg | ORAL_TABLET | Freq: Three times a day (TID) | ORAL | Status: DC | PRN
  Filled 2020-10-19 (×2): qty 1

## 2020-10-19 MED ORDER — ZIPRASIDONE MESYLATE 20 MG IM SOLR: 20.0000 mg | Freq: Once | INTRAMUSCULAR | Status: AC

## 2020-10-19 MED ORDER — ALUM & MAG HYDROXIDE-SIMETH 200-200-20 MG/5ML PO SUSP: 30.0000 mL | ORAL | Status: DC | PRN

## 2020-10-19 MED ORDER — ZIPRASIDONE MESYLATE 20 MG IM SOLR
INTRAMUSCULAR | Status: AC
  Administered 2020-10-19: 20 mg via INTRAMUSCULAR
  Filled 2020-10-19: qty 20

## 2020-10-19 MED ORDER — ACETAMINOPHEN 325 MG PO TABS: 650.0000 mg | ORAL_TABLET | Freq: Four times a day (QID) | ORAL | Status: DC | PRN

## 2020-10-19 MED ORDER — ALBUTEROL SULFATE HFA 108 (90 BASE) MCG/ACT IN AERS: 1.0000 | INHALATION_SPRAY | Freq: Four times a day (QID) | RESPIRATORY_TRACT | Status: DC | PRN

## 2020-10-19 MED ORDER — ZIPRASIDONE MESYLATE 20 MG IM SOLR: 20.0000 mg | INTRAMUSCULAR | Status: DC | PRN

## 2020-10-19 NOTE — ED Notes (Signed)
Pt ambulatory to bathroom and back to room with a  Steady gait in NAD. Pt tearful upon being told she could not go home tonight.

## 2020-10-19 NOTE — ED Notes (Signed)
Shanice [phone] (Pts Friend)

## 2020-10-19 NOTE — ED Notes (Signed)
Pt was wanded by security while at triage but he wants to re wand her shoes when in the tx room.

## 2020-10-19 NOTE — ED Notes (Signed)
Report called to Austin Miles RN at [facility].

## 2020-10-19 NOTE — ED Triage Notes (Signed)
SI x 3 days. She wants to drive her car into a river or strangle herself. Her cousins death anniversary was 2 days ago and this made her have SI. She is not taking medication. She is alone today. Tearful at triage.

## 2020-10-19 NOTE — ED Notes (Signed)
Pt angry, wanting to leave department,  Informed pt that she needed to be evaluated by the psychiatrist.  Pt states she did not really mean that she wanted to kill herself now.  She wants to go home.  Pt states she has to go to work tomorrow.  Encouraged pt to relax.  Security paged to assist with possible flight risk.  Pt has attempted to leave the room numerous times.  Pt wanting to call her mom.

## 2020-10-19 NOTE — ED Notes (Signed)
Pt walked out of ER room and attempted to leave ED. Pt stated she didn't want to stay. Pt redirected to ER room. Pt is noted to be tearful and anxious.

## 2020-10-19 NOTE — ED Notes (Signed)
Pt brought to room by triage nurse.  Pt refuses to undress, will not get off the phone.  Pt crying states she doesn't want to change into scrubs, that she wants to keep her clothes on.  Explained to patient that our focus is keeping her safe.  Explained the process of medical clearance.  Pt crying, states she doesn't want to to get undressed.

## 2020-10-19 NOTE — Progress Notes (Signed)
   10/19/20 2030  COVID-19 Daily Checkoff  Have you had a fever (temp > 37.80C/100F)  in the past 24 hours?  No  COVID-19 EXPOSURE  Have you traveled outside the state in the past 14 days? No  Have you been in contact with someone with a confirmed diagnosis of COVID-19 or PUI in the past 14 days without wearing appropriate PPE? No  Have you been living in the same home as a person with confirmed diagnosis of COVID-19 or a PUI (household contact)? No  Have you been diagnosed with COVID-19? No

## 2020-10-19 NOTE — BH Assessment (Signed)
Tele Assessment Note   Patient Name: Shelia Richards MRN: [id number] Referring Physician: Rolan Bucco, MD Location of Patient: [facility] Location of Provider: Behavioral Health TTS Department  Shelia Richards is an 24 y.o. female present to [facility] with suicidal ideations. Prior to assessment TTS spoke with the PA who just examined the patient. PA report patient is going to be IVC'd. Per report patient has not been sleeping. She has been saying her good byes and was found by a stranger near a river. Patient suicide plan, drive her car into the river.  During TTS assessment patient is tearful an anxious. Report two days ago was the anniversary of her cousin's death. She went to his grave then to work. Report she just snapped stating, "I couldn't take it anymore." Report she has a lot of people who depends on her for different and she just can't take it anymore. Patient stated suicidal ideations present at least 2 days. Plan to drive her car into the river. Denied homicidal ideations, denied auditory/visual hallucinations. Per chart review patient seen in the ED 06/26/2018 for suicidal ideations triggered by an argument with her dad. "Chart review, "Patient reports that she started having thoughts of hurting herself, stating that she took a cord from her computer and wrapped around her neck.  She tied the cord off to the door and sat down on the floor not applying pressure.  She states that she sat there for a while and decided not to go through with any self-harm.  Patient endorses prior history of fleeting suicidal thoughts in the past but has never attempted to commit suicide."  Disposition: Marciano Sequin, NP, recommend inpt tx     Diagnosis:  F32.2  Major depressive disorder, Single episode, Severe  Past Medical History:  Past Medical History:  Diagnosis Date   Asthma    Bronchitis     Past Surgical History:  Procedure Laterality Date   FINGER SURGERY Left    ring finger, couple years  ago    Family History:  Family History  Problem Relation Age of Onset   Lupus Father    CAD Father     Social History:  reports that she has quit smoking. Her smoking use included cigars. She has never used smokeless tobacco. She reports current alcohol use. She reports that she does not use drugs.  Additional Social History:  Alcohol / Drug Use Pain Medications: see MAR Prescriptions: see MAR Over the Counter: see MAR History of alcohol / drug use?: Yes Substance #1 Name of Substance 1: THC 1 - Age of First Use: 19 1 - Amount (size/oz): varies 1 - Frequency: one the weekends 1 - Duration: ongoing 1 - Last Use / Amount: past weekend  CIWA: CIWA-Ar BP: (!) 118/105 Pulse Rate: 86 COWS:    Allergies: No Known Allergies  Home Medications: (Not in a hospital admission)   OB/GYN Status:  Patient's last menstrual period was 09/25/2020.  General Assessment Data Location of Assessment: [facility] TTS Assessment: In system Is this a Tele or Face-to-Face Assessment?: Tele Assessment Is this an Initial Assessment or a Re-assessment for this encounter?: Initial Assessment Living Arrangements: Other (Comment) (lives alone) What gender do you identify as?: Female Date Telepsych consult ordered in [facility]: 10/19/20 Time Telepsych consult ordered in [facility]: 1138 Marital status: Single Pregnancy Status: No Living Arrangements: Alone Can pt return to current living arrangement?: No Admission Status: Involuntary Petitioner: Other ([facility]) Is patient capable of signing voluntary  admission?:  (IVC'd) Referral Source: Self/Family/Friend Insurance type: self-pay     Crisis Care Plan Living Arrangements: Alone Name of Psychiatrist: none report  Name of Therapist: none report   Education Status Is patient currently in school?: No Is the patient employed, unemployed or receiving disability?: Employed  Risk to self with the past 6 months Suicidal Ideation: Yes-Currently  Present Has patient been a risk to self within the past 6 months prior to admission? : No Suicidal Intent: No Has patient had any suicidal intent within the past 6 months prior to admission? : No Is patient at risk for suicide?: Yes Suicidal Plan?: Yes-Currently Present Has patient had any suicidal plan within the past 6 months prior to admission? : No Specify Current Suicidal Plan: driving car into river  Access to Means: Yes Specify Access to Suicidal Means: patient owns a car  What has been your use of drugs/alcohol within the last 12 months?: THC  Previous Attempts/Gestures: Yes How many times?: 1 Other Self Harm Risks: no Triggers for Past Attempts: Other (Comment) (conflict with father ) Intentional Self Injurious Behavior: None Family Suicide History: Unknown Recent stressful life event(s): Other (Comment) (anniversary death of cousin ) Persecutory voices/beliefs?: No Depression: Yes Depression Symptoms: Tearfulness, Guilt, Feeling worthless/self pity Substance abuse history and/or treatment for substance abuse?: No Suicide prevention information given to non-admitted patients: Not applicable  Risk to Others within the past 6 months Homicidal Ideation: No Does patient have any lifetime risk of violence toward others beyond the six months prior to admission? : No Thoughts of Harm to Others: No Current Homicidal Intent: No Current Homicidal Plan: No Access to Homicidal Means: No Identified Victim: n/a History of harm to others?: No Assessment of Violence: None Noted Violent Behavior Description: none noted Does patient have access to weapons?: No Criminal Charges Pending?: No Does patient have a court date: No Is patient on probation?: No  Psychosis Hallucinations: None noted Delusions: None noted  Mental Status Report Appearance/Hygiene: Other (Comment) (dressed appropriately for weather) Eye Contact: Fair Motor Activity: Freedom of movement Speech:  Logical/coherent Level of Consciousness: Crying Mood: Depressed Affect: Depressed Anxiety Level: None Thought Processes: Coherent, Relevant Judgement: Impaired Orientation: Person, Place, Time, Situation Obsessive Compulsive Thoughts/Behaviors: None  Cognitive Functioning Concentration: Good Memory: Recent Intact, Remote Intact Is patient IDD: No Insight: Poor Impulse Control: Poor Appetite: Poor Have you had any weight changes? : No Change Sleep: No Change Total Hours of Sleep: 8 Vegetative Symptoms: None  ADLScreening [facility] Assessment Services) Patient's cognitive ability adequate to safely complete daily activities?: Yes Patient able to express need for assistance with ADLs?: Yes Independently performs ADLs?: Yes (appropriate for developmental age)  Prior Inpatient Therapy Prior Inpatient Therapy: No  Prior Outpatient Therapy Prior Outpatient Therapy: No Does patient have an ACCT team?: No Does patient have Intensive In-House Services?  : No Does patient have [facility] services? : No Does patient have P4CC services?: No  ADL Screening (condition at time of admission) Patient's cognitive ability adequate to safely complete daily activities?: Yes Is the patient deaf or have difficulty hearing?: No Does the patient have difficulty seeing, even when wearing glasses/contacts?: No Does the patient have difficulty concentrating, remembering, or making decisions?: No Patient able to express need for assistance with ADLs?: Yes Does the patient have difficulty dressing or bathing?: No Independently performs ADLs?: Yes (appropriate for developmental age) Does the patient have difficulty walking or climbing stairs?: No       Abuse/Neglect Assessment (Assessment to be complete  while patient is alone) Abuse/Neglect Assessment Can Be Completed: Yes (pt refused to answer the questions)     Advance Directives (For Healthcare) Does Patient Have a Medical Advance Directive?:  No Would patient like information on creating a medical advance directive?: No - Patient declined          Disposition:  Disposition Initial Assessment Completed for this Encounter: Yes Idamae Schuller, NP, recommend inpt tx )   Prabhleen Montemayor 10/19/2020 1:01 PM

## 2020-10-19 NOTE — ED Provider Notes (Signed)
[facility] EMERGENCY DEPARTMENT Provider Note   CSN: [id number] Arrival date & time: 10/19/20  1107     History Chief Complaint  Patient presents with  . Suicidal    Shelia Richards is a 24 y.o. female with no relevant past medical history presents the ED for suicidal ideation.  Patient is sitting on the floor in the corner of the room upon my evaluation, tearful and hysterical.  She is on the phone with her friend, Landscape architect.  I asked her what is going on and she states that nothing brings her happiness anymore.  She states that yesterday she wanted to kill herself and she had a specific plan of driving her car into the river.  She states that she does not really know what happened last night and this morning she was found by a stranger near the river who brought her to the ED for evaluation.  She was very unclear about her whereabouts.  She denies any HI or history of psychiatric hospitalization.  She states that sometimes she "hears things in her house" but denies any other auditory or visual hallucinations.  I stepped out of the room and continued to FaceTime with her friend Roselind Rily who states that she has no only known her for approximately 1 year.  She states that over the course of the past few days, patient has been telling her that she wants to kill herself and even said her goodbyes.  She states that she has been acting particularly bizarre and has not been sleeping.  Patient also states that she lost somebody very close to her approximately 1 year ago which perhaps is contributing to her current crisis.  HPI     Past Medical History:  Diagnosis Date  . Asthma   . Bronchitis     There are no problems to display for this patient.   Past Surgical History:  Procedure Laterality Date  . FINGER SURGERY Left    ring finger, couple years ago     OB History   No obstetric history on file.     Family History  Problem Relation Age of Onset  . Lupus Father   . CAD  Father     Social History   Tobacco Use  . Smoking status: Former Smoker    Types: Cigars  . Smokeless tobacco: Never Used  Vaping Use  . Vaping Use: Never used  Substance Use Topics  . Alcohol use: Yes    Comment: occ wine  . Drug use: No    Home Medications Prior to Admission medications   Medication Sig Start Date End Date Taking? Authorizing Provider  albuterol (PROVENTIL HFA;VENTOLIN HFA) 108 (90 Base) MCG/ACT inhaler Inhale 1-2 puffs into the lungs every 6 (six) hours as needed for wheezing or shortness of breath. 08/08/17  Yes Raeford Razor, MD  lidocaine (XYLOCAINE) 2 % solution Use as directed 15 mLs in the mouth or throat as needed for mouth pain. Swish and spit.  Do not swallow. 09/01/18   Aviva Kluver B, PA-C  methocarbamol (ROBAXIN) 500 MG tablet Take 1 tablet (500 mg total) by mouth 2 (two) times daily. 12/31/18   Maxwell Caul, PA-C  naproxen (NAPROSYN) 500 MG tablet Take 1 tablet (500 mg total) by mouth 2 (two) times daily. 04/19/19   Horton, Mayer Masker, MD  ondansetron (ZOFRAN ODT) 4 MG disintegrating tablet Take 1 tablet (4 mg total) by mouth every 8 (eight) hours as needed. 04/21/19   Long, Arlyss Repress,  MD    Allergies    Patient has no known allergies.  Review of Systems   Review of Systems  Unable to perform ROS: Psychiatric disorder    Physical Exam Updated Vital Signs BP (!) 105/58 (BP Location: Right Arm)   Pulse 60   Temp 98.8 F (37.1 C) (Oral)   Resp 15   Ht 5\' 3"  (1.6 m)   Wt 75.4 kg   LMP 09/25/2020   SpO2 99%   BMI 29.46 kg/m   Physical Exam Vitals and nursing note reviewed. Exam conducted with a chaperone present.  Constitutional:      Appearance: She is not ill-appearing, toxic-appearing or diaphoretic.  HENT:     Head: Normocephalic and atraumatic.  Eyes:     General: No scleral icterus.    Conjunctiva/sclera: Conjunctivae normal.  Cardiovascular:     Rate and Rhythm: Normal rate and regular rhythm.     Pulses: Normal pulses.   Pulmonary:     Effort: Pulmonary effort is normal. No respiratory distress.  Abdominal:     General: Abdomen is flat. There is no distension.     Tenderness: There is no abdominal tenderness.  Musculoskeletal:        General: Normal range of motion.  Skin:    General: Skin is dry.     Capillary Refill: Capillary refill takes less than 2 seconds.  Neurological:     General: No focal deficit present.     Mental Status: She is alert.     GCS: GCS eye subscore is 4. GCS verbal subscore is 5. GCS motor subscore is 6.  Psychiatric:     Comments: Tearful, hysterical.  Noncompliant.     ED Results / Procedures / Treatments   Labs (all labs ordered are listed, but only abnormal results are displayed) Labs Reviewed  SALICYLATE LEVEL - Abnormal; Notable for the following components:      Result Value   Salicylate Lvl <7.0 (*)    All other components within normal limits  ACETAMINOPHEN LEVEL - Abnormal; Notable for the following components:   Acetaminophen (Tylenol), Serum <10 (*)    All other components within normal limits  RAPID URINE DRUG SCREEN, HOSP PERFORMED - Abnormal; Notable for the following components:   Tetrahydrocannabinol POSITIVE (*)    All other components within normal limits  RESPIRATORY PANEL BY RT PCR (FLU A&B, COVID)  COMPREHENSIVE METABOLIC PANEL  ETHANOL  CBC  PREGNANCY, URINE    EKG None  Radiology No results found.  Procedures Procedures (including critical care time)  Medications Ordered in ED Medications  ziprasidone (GEODON) injection 20 mg (20 mg Intramuscular Given 10/19/20 1253)    ED Course  I have reviewed the triage vital signs and the nursing notes.  Pertinent labs & imaging results that were available during my care of the patient were reviewed by me and considered in my medical decision making (see chart for details).    MDM Rules/Calculators/A&P                          Patient is hysterical on my examination.  Her story is  concerning as she states that nothing brings her happiness anymore.  She is already said her goodbyes to her friends and had a very specific plan for suicide yesterday which involved her driving her car into the river.  She somehow woke up near the river today and was subsequently brought to the ER by a stranger.  Patient will be involuntary committed as she is now attempting to leave.  She will require admission for psychiatric stabilization.  I am concerned about the safety of herself.  Medical clearance labs will be obtained, but otherwise cleared.  Geodon ordered given that she was crying hysterically and attempting to leave the hospital to go be with her friend Dareen Piano who herself agrees that she requires hospital stabilization.  Laboratory work-up is all reassuring.  Salicylate level and acetaminophen level still in process.  As is the ethanol level.  Otherwise, medically cleared.  Patient was evaluated by TTS who agrees that patient needs to be admitted for inpatient stabilization.  Update: Patient still does not want to be here and apparently the friend now called in and set that she was "not that serious", even though she specifically told me during our 1 to 1 conversation over Face Time outside of the room that she "made her goodbyes" and had been particularly concerned for her.  Regardless, IVC will not be personally revoked and Marciano Sequin NP recommends inpatient treatment.  Final Clinical Impression(s) / ED Diagnoses Final diagnoses:  Suicidal ideation  Current severe episode of major depressive disorder without psychotic features, unspecified whether recurrent [facility])    Rx / DC Orders ED Discharge Orders    None       Lorelee New, PA-C 10/19/20 1444    Rolan Bucco, MD 10/19/20 1514

## 2020-10-19 NOTE — ED Notes (Signed)
Upon RN walking into room pt was tearful and anxious. When asked to change into hospital scrubs pt refused. Pt is sitting in corner of room tearful, agitated and anxious. Room 12 secured, cabinets locked and garage door shut. Pt states "I do not want to stay." After explaining process pt still refusing to change into scrubs.

## 2020-10-19 NOTE — ED Notes (Signed)
Prior to leaving in police custody, police allowed pt to put street clothes back on.

## 2020-10-19 NOTE — ED Notes (Signed)
Pt sitting on edge of bed tearful. NAD noted, respirations even and unlabored. Skin warm and dry.

## 2020-10-20 ENCOUNTER — Other Ambulatory Visit: Payer: Self-pay

## 2020-10-20 ENCOUNTER — Encounter (HOSPITAL_COMMUNITY): Payer: Self-pay | Admitting: Psychiatric/Mental Health

## 2020-10-20 DIAGNOSIS — F430 Acute stress reaction: Principal | ICD-10-CM

## 2020-10-20 DIAGNOSIS — R45851 Suicidal ideations: Secondary | ICD-10-CM

## 2020-10-20 LAB — LIPID PANEL
Cholesterol: 191 mg/dL (ref 0–200)
HDL: 66 mg/dL (ref >40)
LDL Cholesterol: 118 mg/dL — ABNORMAL HIGH (ref 0–99)
Total CHOL/HDL Ratio: 2.9 RATIO
Triglycerides: 37 mg/dL (ref <150)
VLDL: 7 mg/dL (ref 0–40)

## 2020-10-20 LAB — HEMOGLOBIN A1C
Hgb A1c MFr Bld: 5.1 % (ref 4.8–5.6)
Mean Plasma Glucose: 99.67 mg/dL

## 2020-10-20 LAB — TSH: TSH: 0.764 u[IU]/mL (ref 0.350–4.500)

## 2020-10-20 NOTE — Tx Team (Signed)
Interdisciplinary Treatment and Diagnostic Plan Update  10/20/2020 Time of Session: 9:15am Shelia Richards MRN: [id number]  Principal Diagnosis: <principal problem not specified>  Secondary Diagnoses: Active Problems:   MDD (major depressive disorder), recurrent episode, severe (HCC)   Current Medications:  Current Facility-Administered Medications  Medication Dose Route Frequency Provider Last Rate Last Admin  . acetaminophen (TYLENOL) tablet 650 mg  650 mg Oral Q6H PRN Aldean Baker, NP      . albuterol (VENTOLIN HFA) 108 (90 Base) MCG/ACT inhaler 1-2 puff  1-2 puff Inhalation Q6H PRN Aldean Baker, NP      . alum & mag hydroxide-simeth (MAALOX/MYLANTA) 200-200-20 MG/5ML suspension 30 mL  30 mL Oral Q4H PRN Aldean Baker, NP      . hydrOXYzine (ATARAX/VISTARIL) tablet 25 mg  25 mg Oral TID PRN Aldean Baker, NP      . OLANZapine zydis (ZYPREXA) disintegrating tablet 5 mg  5 mg Oral Q8H PRN Aldean Baker, NP       And  . LORazepam (ATIVAN) tablet 1 mg  1 mg Oral PRN Aldean Baker, NP       And  . ziprasidone (GEODON) injection 20 mg  20 mg Intramuscular PRN Aldean Baker, NP      . magnesium hydroxide (MILK OF MAGNESIA) suspension 30 mL  30 mL Oral Daily PRN Aldean Baker, NP      . traZODone (DESYREL) tablet 50 mg  50 mg Oral QHS PRN Aldean Baker, NP       PTA Medications: No medications prior to admission.    Patient Stressors: Educational concerns Loss of cousin (1 year anniversary was 2 days ago)  Patient Strengths: Active sense of humor Capable of independent living Licensed conveyancer Supportive family/friends  Treatment Modalities: Medication Management, Group therapy, Case management,  1 to 1 session with clinician, Psychoeducation, Recreational therapy.   Physician Treatment Plan for Primary Diagnosis: <principal problem not specified> Long Term Goal(s):     Short Term Goals:    Medication Management: Evaluate patient's response, side  effects, and tolerance of medication regimen.  Therapeutic Interventions: 1 to 1 sessions, Unit Group sessions and Medication administration.  Evaluation of Outcomes: Progressing  Physician Treatment Plan for Secondary Diagnosis: Active Problems:   MDD (major depressive disorder), recurrent episode, severe (HCC)  Long Term Goal(s):     Short Term Goals:       Medication Management: Evaluate patient's response, side effects, and tolerance of medication regimen.  Therapeutic Interventions: 1 to 1 sessions, Unit Group sessions and Medication administration.  Evaluation of Outcomes: Progressing   RN Treatment Plan for Primary Diagnosis: <principal problem not specified> Long Term Goal(s): Knowledge of disease and therapeutic regimen to maintain health will improve  Short Term Goals: Ability to remain free from injury will improve, Ability to verbalize feelings will improve, Ability to identify and develop effective coping behaviors will improve and Compliance with prescribed medications will improve  Medication Management: RN will administer medications as ordered by provider, will assess and evaluate patient's response and provide education to patient for prescribed medication. RN will report any adverse and/or side effects to prescribing provider.  Therapeutic Interventions: 1 on 1 counseling sessions, Psychoeducation, Medication administration, Evaluate responses to treatment, Monitor vital signs and CBGs as ordered, Perform/monitor CIWA, COWS, AIMS and Fall Risk screenings as ordered, Perform wound care treatments as ordered.  Evaluation of Outcomes: Progressing   LCSW Treatment Plan for Primary Diagnosis: <principal problem not specified>  Long Term Goal(s): Safe transition to appropriate next level of care at discharge, Engage patient in therapeutic group addressing interpersonal concerns.  Short Term Goals: Engage patient in aftercare planning with referrals and resources,  Increase social support, Identify triggers associated with mental health/substance abuse issues and Increase skills for wellness and recovery  Therapeutic Interventions: Assess for all discharge needs, 1 to 1 time with Social worker, Explore available resources and support systems, Assess for adequacy in community support network, Educate family and significant other(s) on suicide prevention, Complete Psychosocial Assessment, Interpersonal group therapy.  Evaluation of Outcomes: Progressing   Progress in Treatment: Attending groups: No. and As evidenced by:  patient has not been been on unit long enough to attend group Participating in groups: No. Taking medication as prescribed: Yes. Toleration medication: Yes. Family/Significant other contact made: No, will contact:  if consent is provided Patient understands diagnosis: Yes. Discussing patient identified problems/goals with staff: Yes. Medical problems stabilized or resolved: Yes. Denies suicidal/homicidal ideation: Yes. Issues/concerns per patient self-inventory: No.  New problem(s) identified: No, Describe:  none   New Short Term/Long Term Goal(s): medication stabilization, elimination of SI thoughts, development of comprehensive mental wellness plan.   Patient Goals:  "To be in a good mind space. To know how to identify when my mental health is declining"  Discharge Plan or Barriers: Patient recently admitted. CSW will continue to follow and assess for appropriate referrals and possible discharge planning.   Reason for Continuation of Hospitalization: Depression Medication stabilization  Estimated Length of Stay: 1-3 days   Attendees: Patient: Shelia Richards 10/20/2020   Physician: Gretta Cool, MD 10/20/2020  Nursing:  10/20/2020   RN Care Manager: 10/20/2020  Social Worker: Ruthann Cancer, LCSW 10/20/2020   Recreational Therapist:  10/20/2020  Other:  10/20/2020   Other:  10/20/2020   Other: 10/20/2020      Scribe  for Treatment Team: Otelia Santee, LCSW 10/20/2020 10:01 AM

## 2020-10-20 NOTE — Progress Notes (Signed)
   10/19/20 2030  Psych Admission Type (Psych Patients Only)  Admission Status Involuntary  Psychosocial Assessment  Patient Complaints Anhedonia;Anxiety;Crying spells;Depression;Hopelessness;Irritability;Sadness;Worrying  Eye Contact Fair  Facial Expression Anxious  Affect Depressed;Anxious;Sad  Speech Logical/coherent  Interaction Assertive  Motor Activity Slow  Appearance/Hygiene Unremarkable  Behavior Characteristics Cooperative;Anxious;Guarded  Mood Depressed;Anxious;Sad;Sullen  Thought Process  Coherency WDL  Content WDL  Delusions None reported or observed  Perception WDL  Hallucination None reported or observed  Judgment Poor  Confusion None  Danger to Self  Current suicidal ideation? Denies  Danger to Others  Danger to Others None reported or observed   Pt was offered her PRN vistaril for anxiety and trazodone for sleep, but pt refused the need. No disruptive behaviors noted at this time.

## 2020-10-20 NOTE — [facility] Suicide Risk Assessment (Signed)
[facility] INPATIENT:  Family/Significant Other Suicide Prevention Education  Suicide Prevention Education:  Patient Refusal for Family/Significant Other Suicide Prevention Education: The patient Shelia Richards has refused to provide written consent for family/significant other to be provided Family/Significant Other Suicide Prevention Education during admission and/or prior to discharge.  Physician notified.  CSW completed SPE with the patient.  [name] 10/20/2020, 11:09 AM

## 2020-10-20 NOTE — Progress Notes (Signed)
DAR NOTE: Patient presents with anxious affect and depressed mood.  Denies suicidal thoughts, pain, auditory and visual hallucinations.  Rates depression at 0, hopelessness at 0, and anxiety at 0.  Maintained on routine safety checks.  Medications given as prescribed.  Support and encouragement offered as needed.  Attended group and participated.  States goal for today is "making sure I never get to the point I was when dealing with close death."  Patient observed socializing with peers in the dayroom.  Offered no complaint.

## 2020-10-20 NOTE — Tx Team (Addendum)
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  Felt like "giving up the other day," had SI with a plan to drive car into a river  depression  grief           DISCHARGE CRITERIA:  Ability to meet basic life and health needs Improved stabilization in mood, thinking, and/or behavior Medical problems require only outpatient monitoring Motivation to continue treatment in a less acute level of care Need for constant or close observation no longer present Verbal commitment to aftercare and medication compliance  PRELIMINARY DISCHARGE PLAN: Outpatient therapy Return to previous living arrangement Return to previous work or school arrangements  PATIENT/FAMILY INVOLVEMENT: This treatment plan has been presented to and reviewed with the patient, Shelia Richards, and/or family member.  The patient and family have been given the opportunity to ask questions and make suggestions.  Ephraim Hamburger, RN 10/19/2020, 8:30 PM
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
## 2020-10-20 NOTE — [facility] Group Notes (Signed)
[facility] LCSW Group Therapy  10/20/2020 2:28 PM  Type of Therapy:  Coping Skills   Participation Level:  Active  Participation Quality:  Appropriate  Affect:  Appropriate  Cognitive:  Appropriate  Insight:  Developing/Improving  Engagement in Therapy:  Engaged  Modes of Intervention:  Activity and Discussion  Summary of Progress/Problems: Shelia Richards attended group and remained there the entire time.  Shelia Richards states that coping skills are things that help you calm down and that she uses peaceful music and long drive to cope with things. Shelia Richards played basketball while outside and talked with her peers.   Metro Kung Rhys Anchondo 10/20/2020, 2:28 PM
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
## 2020-10-21 NOTE — Progress Notes (Signed)
Pt appeared paranoid on the unit this evening, pt forwards little    10/21/20 0000  Psych Admission Type (Psych Patients Only)  Admission Status Involuntary  Psychosocial Assessment  Patient Complaints Anxiety;Suspiciousness  Eye Contact Fair  Facial Expression Anxious  Affect Depressed;Anxious;Sad  Speech Logical/coherent  Interaction Assertive  Motor Activity Slow  Appearance/Hygiene Unremarkable  Behavior Characteristics Cooperative  Mood Sad;Preoccupied  Thought Process  Coherency WDL  Content WDL  Delusions None reported or observed  Perception WDL  Hallucination None reported or observed  Judgment Poor  Confusion None  Danger to Self  Current suicidal ideation? Denies  Danger to Others  Danger to Others None reported or observed
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
## 2020-10-21 NOTE — Progress Notes (Signed)
  [facility] Adult Case Management Discharge Plan :  Will you be returning to the same living situation after discharge:  Yes,  to home At discharge, do you have transportation home?: Yes,  friend to pick up Do you have the ability to pay for your medications: No. Samples to be provided at discharge   Release of information consent forms completed and in the chart;  Patient's signature needed at discharge.  Patient to Follow up at:  Follow-up Information    [facility]. Go to.   Specialty: Urgent Care Why: Walk in to start services for therapy and medication management. Contact information: [address] [place] [address] [phone]       [facility], [facility]. Go to.   Why: Walk-in at [facility] to establish services  Contact information: [address] [place] [address] [phone]               Next level of care provider has access to [facility] Link:no  Safety Planning and Suicide Prevention discussed: Yes,  with patient     Has patient been referred to the Quitline?: N/A patient is not a smoker  Patient has been referred for addiction treatment: N/A  Otelia Santee, LCSW 10/21/2020, 11:33 AM

## 2020-10-21 NOTE — BHH Group Notes (Signed)
.  Psychoeducational Group Note  Date: 10-20-20 Time: 0900-1000    Goal Setting   Purpose of Group: This group helps to provide patients with the steps of setting a goal that is specific, measurable, attainable, realistic and time specific. A discussion on how we keep ourselves stuck with negative self talk.    Participation Level:  Active  Participation Quality:  Appropriate  Affect:  Appropriate  Cognitive:  Appropriate  Insight:  Improving  Engagement in Group:  Engaged  Additional Comments:  Pt was able to attend the group but kept her head down with little engagement except to work on her find a word book. Stated she had no idea why she was here and her only goal was to leave this place  Dione Housekeeper

## 2020-10-21 NOTE — BHH Group Notes (Signed)
LCSW Group Therapy Note 10/21/2020  10:00-11:00am  Type of Therapy and Topic:  Group Therapy: Anger   Participation Level:  Minimal   Description of Group: In this group, patients identified a recent time they became angry and how they reacted.  They analyzed how their reaction was possibly beneficial and how it was possibly unhelpful.  They learned how to recognize the physical responses they have to anger-provoking situations that can alert them to the possibility that they are becoming angry.  They learned the difference between healthy coping skills that work and keep working versus unhealthy coping skills that work initially then start hurting. They were taught that anger is a secondary emotion fueled by an underlying feeling.  They explored what their own underlying emotions were during the incident they discussed in group.  Therapeutic Goals: Patients will remember their last incident of anger and how they felt emotionally and physically. Patients will identify how their behavior at that time worked for them, as well as how it worked against them. Patients will be able to identify their reaction as healthy or unhealthy, and identify possible reactions that would have been the opposite Patients will learn that anger itself is a secondary emotion and will think about their primary emotion at the time of their last incident of anger  Summary of Patient Progress:  The patient shared that her most recent time of anger was "now" and said her anger is became "I want to go home".  She then said "I don't want to talk" in an angry manner.  Initially she stood leaning against the wall and looking at the floor, but could be seen to warm to the topic and become interested in what everyone was saying.  Her affect was more relaxed and not angry at the end of group.  Therapeutic Modalities:   Cognitive Behavioral Therapy  Lynnell Chad, LCSW 10/21/2020  9:25 AM

## 2020-10-21 NOTE — Progress Notes (Signed)
Patient denies SI/HI. Patient received both written and verbal discharge instructions. Patient verbalizes understanding of discharge instructions. Patient received an AVS, SRA and transitional record. She gathered belongings from room and locker. Patient safely discharged to the lobby and was picked up by her "best friend."

## 2020-10-21 NOTE — Discharge Summary (Signed)
Physician Discharge Summary Note  Patient:  Shelia Richards is an 24 y.o., female MRN:  [id number] DOB:  08-15-1996 Patient phone:  [phone] (home)  Patient address:   [address] [facility] [place] [address],  Total Time spent with patient: 30 minutes  Date of Admission:  10/19/2020 Date of Discharge: 10/21/2020  Reason for Admission:  Suicidal thoughts  Principal Problem: Acute stress reaction with predominately emotional disturbance Discharge Diagnoses: Principal Problem:   Acute stress reaction with predominately emotional disturbance Active Problems:   Suicidal thoughts   Past Psychiatric History: Patient denies  Past Medical History:  Past Medical History:  Diagnosis Date  . Asthma   . Bronchitis     Past Surgical History:  Procedure Laterality Date  . FINGER SURGERY Left    ring finger, couple years ago   Family History:  Family History  Problem Relation Age of Onset  . Lupus Father   . CAD Father    Family Psychiatric  History: See H&P Social History:  Social History   Substance and Sexual Activity  Alcohol Use Yes   Comment: occasionally, 2 shots once a month     Social History   Substance and Sexual Activity  Drug Use Not Currently   Comment: marijuana use in the past, last used a month ago, "sporadic use"    Social History   Socioeconomic History  . Marital status: Single    Spouse name: Not on file  . Number of children: Not on file  . Years of education: Not on file  . Highest education level: Not on file  Occupational History  . Not on file  Tobacco Use  . Smoking status: Current Some Day Smoker    Types: Cigars  . Smokeless tobacco: Never Used  . Tobacco comment: "blacky mouse" on the weekends  Vaping Use  . Vaping Use: Never used  Substance and Sexual Activity  . Alcohol use: Yes    Comment: occasionally, 2 shots once a month  . Drug use: Not Currently    Comment: marijuana use in the past, last used a month ago, "sporadic  use"  . Sexual activity: Yes    Birth control/protection: None  Other Topics Concern  . Not on file  Social History Narrative  . Not on file   Social Determinants of Health   Financial Resource Strain:   . Difficulty of Paying Living Expenses: Not on file  Food Insecurity:   . Worried About Programme researcher, broadcasting/film/video in the Last Year: Not on file  . Ran Out of Food in the Last Year: Not on file  Transportation Needs:   . Lack of Transportation (Medical): Not on file  . Lack of Transportation (Non-Medical): Not on file  Physical Activity:   . Days of Exercise per Week: Not on file  . Minutes of Exercise per Session: Not on file  Stress:   . Feeling of Stress : Not on file  Social Connections:   . Frequency of Communication with Friends and Family: Not on file  . Frequency of Social Gatherings with Friends and Family: Not on file  . Attends Religious Services: Not on file  . Active Member of Clubs or Organizations: Not on file  . Attends Banker Meetings: Not on file  . Marital Status: Not on file    Hospital Course: Shelia Richards a 24 y.o.[facility]alep[facility] ED. Pt had originally presented herself to the ED on 10/19/2020 for SI  with a plan to drive her car into a river or to strangle herself. Pt was uncooperative, agitated, and anxious during her stay there.   On assessment this morning, Shelia Richards continues to deny SI, HI, AVH.  She denies access to weapons. She repots sleeping and eating well.  She has attended group therapy with active participation and is able to relate learning coping skills to manage her stressors.  She is looking forward to starting outpatient therapy.  Collateral was obtained from her friend/significant other who reported that Shelia Richards sounds "much better", and denies any concerns for Shelia Richards harming herself or others. Patient expresses understanding of how to reach out for mental health help should her stressors return. Her  friend, Shelia Richards is picking her up and she will return home where she stated she feels safe. She is restless and irritable this morning because she was hoping to be discharge first thing and has had to wait. She works two jobs and stated she needs to get to work.  She has follow up therapy instructions in her discharge paperwork. She is currently not taking any medications and prefers to explore the need for them at a later time. Patient is stable and ready for discharge.    Physical Findings: AIMS: Facial and Oral Movements Muscles of Facial Expression: None, normal Lips and Perioral Area: None, normal Jaw: None, normal Tongue: None, normal,Extremity Movements Upper (arms, wrists, hands, fingers): None, normal Lower (legs, knees, ankles, toes): None, normal, Trunk Movements Neck, shoulders, hips: None, normal, Overall Severity Severity of abnormal movements (highest score from questions above): None, normal Incapacitation due to abnormal movements: None, normal Patient's awareness of abnormal movements (rate only patient's report): No Awareness, Dental Status Current problems with teeth and/or dentures?: No Does patient usually wear dentures?: No  CIWA:    COWS:     Musculoskeletal: Strength & Muscle Tone: within normal limits Gait & Station: normal Patient leans: N/A  Psychiatric Specialty Exam: Physical Exam Vitals and nursing note reviewed.   Review of Systems negative except for that mentioned in the HPI. Cranial nerves II - XII are within normal limits. Psychiatric/Behavioral: Negative for agitation, behavioral problems, confusion, decreased concentration, hallucinations, self-injury, sleep disturbance and suicidal ideas.   Blood pressure 113/74, pulse 91, temperature 98.9 F (37.2 C), temperature source Oral, resp. rate 17, height 5\' 3"  (1.6 m), weight 73 kg, last menstrual period 09/25/2020, SpO2 100 %.Body mass index is 28.52 kg/m.  General Appearance: Casual  Eye Contact:   Good  Speech:  Clear and Coherent and Normal Rate  Volume:  Normal  Mood:  Anxious, Irritable and was hoping for discharg early in the morning, is anxious and irritable that she had to wait.   Affect:  Congruent  Thought Process:  Coherent, Goal Directed and Descriptions of Associations: Intact  Orientation:  Full (Time, Place, and Person)  Thought Content:  Logical and Hallucinations: None  Suicidal Thoughts:  No  Homicidal Thoughts:  No  Memory:  Immediate;   Good Recent;   Good Remote;   Good  Judgement:  Fair  Insight:  Fair  Psychomotor Activity:  Restlessness  Concentration:  Concentration: Fair and Attention Span: Fair  Recall:  Good  Fund of Knowledge:  Good  Language:  Good  Akathisia:  No  Handed:  Right  AIMS (if indicated):   0  Assets:  Communication Skills Desire for Improvement Financial Resources/Insurance Housing Physical Health Resilience Social Support Talents/Skills Transportation Vocational/Educational  ADL's:  Intact  Cognition:  WNL  Sleep:  Number of Hours: 6.75        Has this patient used any form of tobacco in the last 30 days? (Cigarettes, Smokeless Tobacco, Cigars, and/or Pipes) No  Blood Alcohol level:  Lab Results  Component Value Date   ETH <10 10/19/2020   ETH <10 06/16/2018    Metabolic Disorder Labs:  Lab Results  Component Value Date   HGBA1C 5.1 10/20/2020   MPG 99.67 10/20/2020   No results found for: PROLACTIN Lab Results  Component Value Date   CHOL 191 10/20/2020   TRIG 37 10/20/2020   HDL 66 10/20/2020   CHOLHDL 2.9 10/20/2020   VLDL 7 10/20/2020   LDLCALC 118 (H) 10/20/2020    See Psychiatric Specialty Exam and Suicide Risk Assessment completed by Attending Physician prior to discharge.  Discharge destination:  Home  Is patient on multiple antipsychotic therapies at discharge:  No   Has Patient had three or more failed trials of antipsychotic monotherapy by history:  No  Recommended Plan for Multiple  Antipsychotic Therapies: NA   Allergies as of 10/21/2020      Reactions   Other Anaphylaxis, Shortness Of Breath, Swelling   mushrooms      Medication List    You have not been prescribed any medications.     Follow-up Information    [facility]. Go to.   Specialty: Urgent Care Why: Walk in to start services for therapy and medication management. Contact information: [address] [place] [address] [phone]       [facility]. Go to.   Why: Walk-in at [facility] to establish services  Contact information: [address] [place] [address] [phone]               Follow-up recommendations:  Activity:  as tolerated Diet:  Heart healthy  Comments:  On day of discharge following sustained improvement in the affect of this patient, continued report of euthymic mood, repeated denial of suicidal, homicidal, and other violent ideation, adequate interaction with peers, active participation in groups while on the unit, and denial of adverse reactions from medications, the treatment team decided Shelia Richards was stable for discharge home with scheduled mental health treatment.  She was able to engage in safety planning including plan to return to nearest emergency room or contact emergency services if she feels unable to maintain her own safety or the safety of others. Patient had no further questions, comments, or concerns. Discharge into care of friend, who agrees to maintain patient safety.  Patient aware to return to nearest crisis center, ED or to call 911 for worsening symptoms of depression, suicidal or homicidal thoughts or AVH.  Signed: Laveda Abbe, NP 10/21/2020, 10:27 AM
